# Patient Record
Sex: Female | Born: 1980 | Race: White | Hispanic: No | Marital: Married | Smoking: Never smoker
Health system: Southern US, Community
[De-identification: ages and names within clinical notes are randomized; demographics above are authoritative.]

## PROBLEM LIST (undated history)

## (undated) DIAGNOSIS — N200 Calculus of kidney: Secondary | ICD-10-CM

## (undated) DIAGNOSIS — K219 Gastro-esophageal reflux disease without esophagitis: Secondary | ICD-10-CM

## (undated) DIAGNOSIS — B977 Papillomavirus as the cause of diseases classified elsewhere: Secondary | ICD-10-CM

## (undated) DIAGNOSIS — L719 Rosacea, unspecified: Secondary | ICD-10-CM

## (undated) DIAGNOSIS — Z800 Family history of malignant neoplasm of digestive organs: Secondary | ICD-10-CM

## (undated) HISTORY — DX: Papillomavirus as the cause of diseases classified elsewhere: B97.7

## (undated) HISTORY — DX: Gastro-esophageal reflux disease without esophagitis: K21.9

## (undated) HISTORY — PX: WISDOM TOOTH EXTRACTION: SHX21

## (undated) HISTORY — PX: LITHOTRIPSY: SUR834

## (undated) HISTORY — DX: Family history of malignant neoplasm of digestive organs: Z80.0

## (undated) HISTORY — DX: Calculus of kidney: N20.0

## (undated) HISTORY — DX: Rosacea, unspecified: L71.9

---

## 2006-07-06 ENCOUNTER — Emergency Department: Payer: Self-pay | Admitting: Emergency Medicine

## 2009-03-13 ENCOUNTER — Ambulatory Visit: Payer: Self-pay | Admitting: Gastroenterology

## 2009-10-05 HISTORY — PX: COLONOSCOPY: SHX174

## 2010-01-09 DIAGNOSIS — J329 Chronic sinusitis, unspecified: Secondary | ICD-10-CM | POA: Insufficient documentation

## 2013-03-18 ENCOUNTER — Emergency Department: Payer: Self-pay | Admitting: Internal Medicine

## 2013-03-18 LAB — URINALYSIS, COMPLETE
Bilirubin,UR: NEGATIVE
Glucose,UR: NEGATIVE mg/dL (ref 0–75)
Nitrite: NEGATIVE
Ph: 8 (ref 4.5–8.0)
Protein: NEGATIVE
RBC,UR: 78 /HPF (ref 0–5)
Squamous Epithelial: 4
WBC UR: 1 /HPF (ref 0–5)

## 2013-03-18 LAB — BASIC METABOLIC PANEL
Chloride: 107 mmol/L (ref 98–107)
Creatinine: 0.98 mg/dL (ref 0.60–1.30)
EGFR (African American): >60
EGFR (Non-African Amer.): >60
Glucose: 99 mg/dL (ref 65–99)
Potassium: 3.8 mmol/L (ref 3.5–5.1)
Sodium: 140 mmol/L (ref 136–145)

## 2013-03-18 LAB — CBC
HCT: 36.5 % (ref 35.0–47.0)
HGB: 12.8 g/dL (ref 12.0–16.0)
MCHC: 35.1 g/dL (ref 32.0–36.0)
MCV: 89 fL (ref 80–100)
Platelet: 349 10*3/uL (ref 150–440)
RBC: 4.09 10*6/uL (ref 3.80–5.20)
RDW: 12.4 % (ref 11.5–14.5)
WBC: 4.7 10*3/uL (ref 3.6–11.0)

## 2013-06-07 ENCOUNTER — Emergency Department: Payer: Self-pay | Admitting: Internal Medicine

## 2013-06-07 LAB — COMPREHENSIVE METABOLIC PANEL
Anion Gap: 7 (ref 7–16)
BUN: 13 mg/dL (ref 7–18)
Bilirubin,Total: 1.1 mg/dL — ABNORMAL HIGH (ref 0.2–1.0)
Calcium, Total: 8.6 mg/dL (ref 8.5–10.1)
Chloride: 101 mmol/L (ref 98–107)
Co2: 27 mmol/L (ref 21–32)
Creatinine: 1.03 mg/dL (ref 0.60–1.30)
EGFR (African American): >60
EGFR (Non-African Amer.): >60
Osmolality: 271 (ref 275–301)
SGOT(AST): 19 U/L (ref 15–37)
SGPT (ALT): 15 U/L (ref 12–78)

## 2013-06-07 LAB — CBC
HCT: 37.5 % (ref 35.0–47.0)
HGB: 13.1 g/dL (ref 12.0–16.0)
MCH: 30.9 pg (ref 26.0–34.0)
MCV: 88 fL (ref 80–100)
Platelet: 343 10*3/uL (ref 150–440)
WBC: 10.9 10*3/uL (ref 3.6–11.0)

## 2013-06-07 LAB — URINALYSIS, COMPLETE
Protein: 100
RBC,UR: 122 /HPF (ref 0–5)

## 2013-06-07 LAB — PREGNANCY, URINE: Pregnancy Test, Urine: NEGATIVE m[IU]/mL

## 2014-02-15 ENCOUNTER — Ambulatory Visit: Payer: Self-pay | Admitting: Obstetrics and Gynecology

## 2014-11-21 ENCOUNTER — Other Ambulatory Visit: Payer: 59

## 2014-11-21 ENCOUNTER — Encounter: Payer: Self-pay | Admitting: General Surgery

## 2014-11-21 ENCOUNTER — Ambulatory Visit (INDEPENDENT_AMBULATORY_CARE_PROVIDER_SITE_OTHER): Payer: 59 | Admitting: General Surgery

## 2014-11-21 VITALS — BP 118/74 | HR 74 | Resp 12 | Ht 66.5 in | Wt 181.0 lb

## 2014-11-21 DIAGNOSIS — N6310 Unspecified lump in the right breast, unspecified quadrant: Secondary | ICD-10-CM | POA: Insufficient documentation

## 2014-11-21 DIAGNOSIS — N63 Unspecified lump in breast: Secondary | ICD-10-CM

## 2014-11-21 HISTORY — PX: BREAST BIOPSY: SHX20

## 2014-11-21 NOTE — Progress Notes (Signed)
Patient ID: Briana Cummings, female   DOB: [birth year], 34 y.o.   MRN: [id number]  Chief Complaint  Patient presents with  . Other    right breast mass    HPI Briana Cummings is a 34 y.o. female who presents for an evaluation of a right breast mass. The patient's most recent mammogram was performed on 02/15/14 as well as a right breast ultrasound. The patient states her OBGYN found the lump in March/April 2015. She states the area is tender at times. She has noticed a change in the size of the lump since first noticing it. It has developed a more nodular character. She has not appreciated fluctuation in size or tenderness in relation to her menses. It's location in the upper outer quadrant of the right breast. No injuries to the breasts.    HPI  Past Medical History  Diagnosis Date  . Kidney stones   . HPV in female   . GERD (gastroesophageal reflux disease)   . Rosacea     Past Surgical History  Procedure Laterality Date  . Wisdom tooth extraction    . Lithotripsy    . Colonoscopy  2011    because of family history    Family History  Problem Relation Age of Onset  . Cancer Mother 5651    colon/genetic negative  . Cancer Paternal Grandmother   . Cancer Paternal Grandfather     Social History History  Substance Use Topics  . Smoking status: Never Smoker   . Smokeless tobacco: Never Used  . Alcohol Use: 0.0 oz/week    0 Standard drinks or equivalent per week     Comment: occasionally    Allergies  Allergen Reactions  . Amoxicillin Rash    Current Outpatient Prescriptions  Medication Sig Dispense Refill  . ibuprofen (ADVIL,MOTRIN) 200 MG tablet Take 200 mg by mouth as needed.     No current facility-administered medications for this visit.    Review of Systems Review of Systems  Constitutional: Negative.   Respiratory: Negative.   Cardiovascular: Negative.     Blood pressure 118/74, pulse 74, resp. rate 12, height 5' 6.5" (1.689 m), weight 181 lb (82.101 kg),  last menstrual period 11/18/2014.  Physical Exam Physical Exam  Constitutional: She is oriented to person, place, and time. She appears well-developed and well-nourished.  Neck: Neck supple.  Cardiovascular: Normal rate, regular rhythm and normal heart sounds.   Pulmonary/Chest: Effort normal and breath sounds normal. Right breast exhibits no inverted nipple, no mass, no nipple discharge, no skin change and no tenderness. Left breast exhibits no inverted nipple, no mass, no nipple discharge, no skin change and no tenderness.    Focal thickening 2 cm area right axillary tail  Lymphadenopathy:    She has no cervical adenopathy.    She has no axillary adenopathy.  Neurological: She is alert and oriented to person, place, and time.  Skin: Skin is warm and dry.    Data Reviewed Mammograms dated 02/15/2014 as well as associated ultrasound of the right breast were reviewed. Dense breast tissue with a normal lymph node reported. BI-RADS-3.  PCP notes of 10/30/2014 reviewed.  Ultrasound examination was completed to reassess the area that the patient feels is larger and more nodular. There is significant variability on how close the underlying breast parenchyma approaches the overlying skin. The area of prominence near the axillary tail is an area where the breast parenchyma approached within 6 mm of the overlying skin. At the 11:00 position, 10 cm  from the nipple the previously identified lymph node is noted measuring 0.5 x 0.6 x 1.0 cm. There is variably hyperechoic and hypoechoic breast tissue associated with this node.  The patient was offered biopsy to confirm the clinical impression of benign breast parenchyma and lymph node. Pros and cons of the procedure were reviewed. She was amenable to proceed.  10 mL of 0.5% Xylocaine with 0.25% Marcaine with 1-200,000 of epinephrine was utilized well tolerated. ChloraPrep was applied to the skin. A 14-gauge Finesse device was utilized and approximately  10 core samples obtained throughout the area of concern. The patient tolerated the procedure well. Scant discomfort. Postbiopsy clip was placed. Skin defect closed with benzoin and Steri-Strips followed by Telfa and Tegaderm dressing.  Assessment    Right breast mass, likely prominent parenchyma.    Plan    Postbiopsy instructions were provided. She'll be contacted when pathology is returned. She'll plan for a follow up with the staff in one week for wound check.       PCP:  Lorie Phenix Ref. MD: [facility]   Donnalee Curry W 11/21/2014, 9:22 PM

## 2014-11-21 NOTE — Patient Instructions (Signed)

## 2014-11-22 ENCOUNTER — Telehealth: Payer: Self-pay | Admitting: *Deleted

## 2014-11-22 NOTE — Telephone Encounter (Signed)
Phone call from Dr Luisa HartPatrick most recent breast biopsy, benign- Pseudoangiomatous stromal hyperplasia-PASH. Notify patient biopsy "OK" per Dr. Lemar LivingsByrnett.

## 2014-11-22 NOTE — Telephone Encounter (Signed)
Notified patient as instructed, patient pleased. Discussed follow-up appointments, patient agrees  

## 2014-11-28 ENCOUNTER — Ambulatory Visit (INDEPENDENT_AMBULATORY_CARE_PROVIDER_SITE_OTHER): Payer: 59 | Admitting: *Deleted

## 2014-11-28 DIAGNOSIS — N6310 Unspecified lump in the right breast, unspecified quadrant: Secondary | ICD-10-CM

## 2014-11-28 DIAGNOSIS — N63 Unspecified lump in breast: Secondary | ICD-10-CM

## 2014-11-28 NOTE — Progress Notes (Signed)
Patient here today for follow up post right breast biopsy.Minimal bruising noted.  The patient is aware that a heating pad may be used for comfort as needed.  Aware of pathology. Follow up in one month with doctor.

## 2014-12-25 ENCOUNTER — Ambulatory Visit (INDEPENDENT_AMBULATORY_CARE_PROVIDER_SITE_OTHER): Payer: 59 | Admitting: General Surgery

## 2014-12-25 ENCOUNTER — Encounter: Payer: Self-pay | Admitting: General Surgery

## 2014-12-25 VITALS — BP 124/72 | HR 82 | Resp 12 | Ht 66.5 in | Wt 181.0 lb

## 2014-12-25 DIAGNOSIS — N6489 Other specified disorders of breast: Secondary | ICD-10-CM | POA: Insufficient documentation

## 2014-12-25 DIAGNOSIS — N62 Hypertrophy of breast: Secondary | ICD-10-CM

## 2014-12-25 NOTE — Patient Instructions (Addendum)
Continue self breast exams. Call office for any new breast issues or concerns. The patient is aware to call back for any questions or concerns. 

## 2014-12-25 NOTE — Progress Notes (Signed)
Patient ID: Briana Cummings, female   DOB: 05/05/1981, 34 y.o.   MRN: [id number]  Chief Complaint  Patient presents with  . Follow-up    right breast biopsy    HPI Briana Cummings is a 34 y.o. female.  Here today for follow up of right breast biopsy on 11-21-14. No new breast issues. Occasional pain in left breast that comes and goes for about 4-5 months. Described as more of an "awareness" and sporadic.  HPI  Past Medical History  Diagnosis Date  . Kidney stones   . HPV in female   . GERD (gastroesophageal reflux disease)   . Rosacea     Past Surgical History  Procedure Laterality Date  . Wisdom tooth extraction    . Lithotripsy    . Colonoscopy  2011    because of family history  . Breast biopsy Right 11-21-14    PSEUDOANGIOMATOUS STROMAL HYPERPLASIA    Family History  Problem Relation Age of Onset  . Cancer Mother 5651    colon/genetic negative  . Cancer Paternal Grandmother   . Cancer Paternal Grandfather   . Cancer Paternal Aunt     great Aunt/breast  . Cancer Paternal Aunt     great Aunt/breast    Social History History  Substance Use Topics  . Smoking status: Never Smoker   . Smokeless tobacco: Never Used  . Alcohol Use: 0.0 oz/week    0 Standard drinks or equivalent per week     Comment: occasionally    Allergies  Allergen Reactions  . Amoxicillin Rash    Current Outpatient Prescriptions  Medication Sig Dispense Refill  . ibuprofen (ADVIL,MOTRIN) 200 MG tablet Take 200 mg by mouth as needed.    . loratadine (CLARITIN) 10 MG tablet Take 10 mg by mouth daily as needed.   6  . montelukast (SINGULAIR) 10 MG tablet Take 10 mg by mouth as needed.   4   No current facility-administered medications for this visit.    Review of Systems Review of Systems  Constitutional: Negative.   Respiratory: Negative.   Cardiovascular: Negative.     Blood pressure 124/72, pulse 82, resp. rate 12, height 5' 6.5" (1.689 m), weight 181 lb (82.101 kg), last menstrual  period 12/16/2014.  Physical Exam Physical Exam  Constitutional: She is oriented to person, place, and time. She appears well-developed and well-nourished.  Neck: Neck supple.  Cardiovascular: Normal rate, regular rhythm and normal heart sounds.   Pulmonary/Chest: Effort normal and breath sounds normal. Right breast exhibits no inverted nipple, no mass, no nipple discharge, no skin change and no tenderness. Left breast exhibits no inverted nipple, no mass, no nipple discharge, no skin change and no tenderness.    Lymphadenopathy:    She has no cervical adenopathy.    She has no axillary adenopathy.  Neurological: She is alert and oriented to person, place, and time.  Skin: Skin is warm and dry.    Data Reviewed Pathology of the February 2016 biopsies showed pseudo-adenomatous stromal hyperplasia. No atypia or malignancy.   Assessment    Benign breast exam.    Plan    The patient's family history does not warrant screening for hereditary breast cancer.  Screening mammograms at age 34.  The patient was encouraged to continue monthly self examination.    Follow up as needed. Continue self breast exams. Call office for any new breast issues or concerns.    PCP:  Lonn GeorgiaMaloney, Nancy  Tynan Boesel W 12/25/2014, 8:24 PM

## 2015-10-06 HISTORY — PX: BREAST LUMPECTOMY: SHX2

## 2015-10-23 DIAGNOSIS — D239 Other benign neoplasm of skin, unspecified: Secondary | ICD-10-CM

## 2015-10-23 HISTORY — DX: Other benign neoplasm of skin, unspecified: D23.9

## 2017-02-17 ENCOUNTER — Encounter: Payer: Self-pay | Admitting: Obstetrics and Gynecology

## 2017-02-17 ENCOUNTER — Ambulatory Visit (INDEPENDENT_AMBULATORY_CARE_PROVIDER_SITE_OTHER): Payer: Commercial Managed Care - HMO | Admitting: Obstetrics and Gynecology

## 2017-02-17 VITALS — BP 110/70 | HR 86 | Ht 66.0 in | Wt 188.0 lb

## 2017-02-17 DIAGNOSIS — Z1151 Encounter for screening for human papillomavirus (HPV): Secondary | ICD-10-CM | POA: Diagnosis not present

## 2017-02-17 DIAGNOSIS — Z1231 Encounter for screening mammogram for malignant neoplasm of breast: Secondary | ICD-10-CM | POA: Diagnosis not present

## 2017-02-17 DIAGNOSIS — Z1239 Encounter for other screening for malignant neoplasm of breast: Secondary | ICD-10-CM

## 2017-02-17 DIAGNOSIS — Z01419 Encounter for gynecological examination (general) (routine) without abnormal findings: Secondary | ICD-10-CM | POA: Diagnosis not present

## 2017-02-17 DIAGNOSIS — Z124 Encounter for screening for malignant neoplasm of cervix: Secondary | ICD-10-CM | POA: Diagnosis not present

## 2017-02-17 DIAGNOSIS — Z800 Family history of malignant neoplasm of digestive organs: Secondary | ICD-10-CM | POA: Diagnosis not present

## 2017-02-17 NOTE — Progress Notes (Signed)
Chief Complaint  Patient presents with  . Gynecologic Exam     HPI:      [name] is a 36 y.o. G0P0000 who LMP was Patient's last menstrual period was 01/29/2017 (exact date)., presents today for her annual examination.  Her menses are regular every 28-30 days, lasting 5 days.  Dysmenorrhea moderate, occurring first 1-2 days of flow. She takes ibup with relief. She does not have intermenstrual bleeding.  Sex activity: single partner, contraception - condoms most of the time. She declines any other BC. She may want to conceive in future. Last Pap: Feb 13, 2015  Results were: no abnormalities /neg HPV DNA . Hx of ASCUS/POS HPV DNA 2016 with neg colpo/bx.  Hx of STDs: HPV  Last mammogram:2016. Pt had mass that was PASH on bx. She saw Dr. Wendee Copp at [facility] last yr. She is unsure if she is due for another mammogram.  There is no FH of breast cancer. There is no FH of ovarian cancer. The patient does do self-breast exams.  Tobacco use: The patient denies current or previous tobacco use. Alcohol use: none Exercise: not active  She does not get adequate calcium and Vitamin D in her diet.  She has a FH of colon cancer in her mom and has already had a colonosopy. She thinks she is due again at age 49.  Past Medical History:  Diagnosis Date  . GERD (gastroesophageal reflux disease)   . HPV in female   . Kidney stones   . Rosacea     Past Surgical History:  Procedure Laterality Date  . BREAST BIOPSY Right 11-21-14   PSEUDOANGIOMATOUS STROMAL HYPERPLASIA  . COLONOSCOPY  2011   because of family history  . LITHOTRIPSY    . WISDOM TOOTH EXTRACTION      Family History  Problem Relation Age of Onset  . Cancer Mother 4       colon/genetic negative  . Cancer Paternal Grandmother        breast  . Cancer Paternal Grandfather        lung  . Heart attack Paternal Grandfather   . Cancer Paternal Aunt         great aunt/ ? breast  . Cancer Paternal Aunt        great Aunt/?  breast  . Prostate cancer Maternal Uncle     Social History   Social History  . Marital status: Married    Spouse name: N/A  . Number of children: N/A  . Years of education: N/A   Occupational History  . Not on file.   Social History Main Topics  . Smoking status: Never Smoker  . Smokeless tobacco: Never Used  . Alcohol use 0.0 oz/week     Comment: occasionally  . Drug use: No  . Sexual activity: Yes    Birth control/ protection: None   Other Topics Concern  . Not on file   Social History Narrative  . No narrative on file     Current Outpatient Prescriptions:  .  ibuprofen (ADVIL,MOTRIN) 200 MG tablet, Take 200 mg by mouth as needed., Disp: , Rfl:   ROS:  Review of Systems  Constitutional: Negative for fatigue, fever and unexpected weight change.  Respiratory: Negative for cough, shortness of breath and wheezing.   Cardiovascular: Negative for chest pain, palpitations and leg swelling.  Gastrointestinal: Positive for diarrhea. Negative for blood in stool, constipation, nausea and vomiting.  Endocrine: Negative for cold intolerance, heat intolerance and  polyuria.  Genitourinary: Positive for vaginal discharge. Negative for dyspareunia, dysuria, flank pain, frequency, genital sores, hematuria, menstrual problem, pelvic pain, urgency, vaginal bleeding and vaginal pain.  Musculoskeletal: Negative for back pain, joint swelling and myalgias.  Skin: Negative for rash.  Neurological: Positive for headaches. Negative for dizziness, syncope, light-headedness and numbness.  Hematological: Negative for adenopathy.  Psychiatric/Behavioral: Negative for agitation, confusion, sleep disturbance and suicidal ideas. The patient is not nervous/anxious.      Objective: BP 110/70   Pulse 86   Ht 5\' 6"  (1.676 m)   Wt 188 lb (85.3 kg)   LMP 01/29/2017 (Exact Date)   BMI 30.34 kg/m    Physical Exam  Constitutional: She is oriented to person, place, and time. She appears  well-developed and well-nourished.  Genitourinary: Vagina normal and uterus normal. There is no rash or tenderness on the right labia. There is no rash or tenderness on the left labia. No erythema or tenderness in the vagina. No vaginal discharge found. Right adnexum does not display mass and does not display tenderness. Left adnexum does not display mass and does not display tenderness. Cervix does not exhibit motion tenderness or polyp. Uterus is not enlarged or tender.  Neck: Normal range of motion. No thyromegaly present.  Cardiovascular: Normal rate, regular rhythm and normal heart sounds.   No murmur heard. Pulmonary/Chest: Effort normal and breath sounds normal. Right breast exhibits no mass, no nipple discharge, no skin change and no tenderness. Left breast exhibits no mass, no nipple discharge, no skin change and no tenderness.  Abdominal: Soft. There is no tenderness. There is no guarding.  Musculoskeletal: Normal range of motion.  Neurological: She is alert and oriented to person, place, and time. No cranial nerve deficit.  Psychiatric: She has a normal mood and affect. Her behavior is normal.  Vitals reviewed.    Assessment/Plan: Encounter for annual routine gynecological examination  Cervical cancer screening - Plan: IGP, Aptima HPV  Screening for HPV (human papillomavirus) - Plan: IGP, Aptima HPV  Screening for breast cancer - Hx of PASH. Pt to f/u with Dr. Wendee CoppGeorgaide as to when she is due for mammograms. Will call for ref prn.  Family history of colon cancer - Pt due for repeat colonoscopy age 36.              GYN counsel mammography screening, adequate intake of calcium and vitamin D, diet and exercise     F/U  Return in about 1 year (around 02/17/2018).  Alicia B. Copland, PA-C 02/17/2017 8:43 AM

## 2017-02-20 LAB — IGP, APTIMA HPV
HPV Aptima: NEGATIVE
PAP Smear Comment: 0

## 2018-02-21 ENCOUNTER — Other Ambulatory Visit: Payer: Self-pay

## 2018-02-21 ENCOUNTER — Ambulatory Visit (INDEPENDENT_AMBULATORY_CARE_PROVIDER_SITE_OTHER): Payer: 59 | Admitting: Obstetrics and Gynecology

## 2018-02-21 ENCOUNTER — Encounter: Payer: Self-pay | Admitting: Obstetrics and Gynecology

## 2018-02-21 VITALS — BP 120/78 | HR 110 | Ht 66.0 in | Wt 195.0 lb

## 2018-02-21 DIAGNOSIS — Z800 Family history of malignant neoplasm of digestive organs: Secondary | ICD-10-CM

## 2018-02-21 DIAGNOSIS — N62 Hypertrophy of breast: Secondary | ICD-10-CM

## 2018-02-21 DIAGNOSIS — Z13220 Encounter for screening for lipoid disorders: Secondary | ICD-10-CM | POA: Diagnosis not present

## 2018-02-21 DIAGNOSIS — Z1151 Encounter for screening for human papillomavirus (HPV): Secondary | ICD-10-CM | POA: Diagnosis not present

## 2018-02-21 DIAGNOSIS — Z1239 Encounter for other screening for malignant neoplasm of breast: Secondary | ICD-10-CM

## 2018-02-21 DIAGNOSIS — Z01419 Encounter for gynecological examination (general) (routine) without abnormal findings: Secondary | ICD-10-CM

## 2018-02-21 DIAGNOSIS — Z1321 Encounter for screening for nutritional disorder: Secondary | ICD-10-CM | POA: Diagnosis not present

## 2018-02-21 DIAGNOSIS — Z124 Encounter for screening for malignant neoplasm of cervix: Secondary | ICD-10-CM

## 2018-02-21 DIAGNOSIS — Z131 Encounter for screening for diabetes mellitus: Secondary | ICD-10-CM

## 2018-02-21 DIAGNOSIS — N6489 Other specified disorders of breast: Secondary | ICD-10-CM

## 2018-02-21 DIAGNOSIS — Z0000 Encounter for general adult medical examination without abnormal findings: Secondary | ICD-10-CM | POA: Diagnosis not present

## 2018-02-21 DIAGNOSIS — Z1231 Encounter for screening mammogram for malignant neoplasm of breast: Secondary | ICD-10-CM

## 2018-02-21 NOTE — Progress Notes (Signed)
Chief Complaint  Patient presents with  . Gynecologic Exam    No complaints     HPI:      Briana Cummings is a 37 y.o. G0P0000 who LMP was Patient's last menstrual period was 01/27/2018., presents today for her annual examination. Her menses are regular every 28-30 days, lasting 5 days. Dysmenorrhea moderate, occurring first 1-2 days of flow. She takes ibup with relief. She does not have intermenstrual bleeding.  Sex activity: single partner, contraception. She declines any other BC.  Last Pap: 02/17/17  Results were: no abnormalities /neg HPV DNA . Hx of ASCUS/POS HPV DNA 2016 with neg colpo/bx.  Hx of STDs: HPV  Last mammogram:2016. Pt had mass that was PASH on bx. She saw Dr. Wendee Copp at [facility] last yr. Never had repeat mammo last yr. There is a FH of breast cancer in her pat aunt, genetic testing not indicated. There is no FH of ovarian cancer. The patient does do self-breast exams. She has a FH of colon cancer in her mom ("gene neg") and has already had a colonosopy. She thinks she is due again at age 85.  Tobacco use: The patient denies current or previous tobacco use. Alcohol use: none Exercise: not active  She does get adequate calcium but not Vitamin D in her diet. Due for fasting labs.   Past Medical History:  Diagnosis Date  . Family history of colon cancer    mom age 66; colonoscopy due age 18  . GERD (gastroesophageal reflux disease)   . HPV in female   . Kidney stones   . Rosacea     Past Surgical History:  Procedure Laterality Date  . BREAST BIOPSY Right 11-21-14   PSEUDOANGIOMATOUS STROMAL HYPERPLASIA  . BREAST LUMPECTOMY Right 2017  . COLONOSCOPY  2011   because of family history  . LITHOTRIPSY    . WISDOM TOOTH EXTRACTION      Family History  Problem Relation Age of Onset  . Colon cancer Mother 13       genetic negative  . Cancer Paternal Grandmother        lung primary, extensive mets  . Cancer Paternal Grandfather        lung  .  Heart attack Paternal Grandfather   . Breast cancer Paternal Aunt 38  . Prostate cancer Maternal Uncle     Social History   Socioeconomic History  . Marital status: Married    Spouse name: Not on file  . Number of children: Not on file  . Years of education: Not on file  . Highest education level: Not on file  Occupational History  . Not on file  Social Needs  . Financial resource strain: Not on file  . Food insecurity:    Worry: Not on file    Inability: Not on file  . Transportation needs:    Medical: Not on file    Non-medical: Not on file  Tobacco Use  . Smoking status: Never Smoker  . Smokeless tobacco: Never Used  Substance and Sexual Activity  . Alcohol use: Yes    Alcohol/week: 0.0 oz    Comment: occasionally  . Drug use: No  . Sexual activity: Yes    Birth control/protection: None  Lifestyle  . Physical activity:    Days per week: 0 days    Minutes per session: Not on file  . Stress: Not on file  Relationships  . Social connections:    Talks on phone: Not on  file    Gets together: Not on file    Attends religious service: Not on file    Active member of club or organization: Not on file    Attends meetings of clubs or organizations: Not on file    Relationship status: Not on file  . Intimate partner violence:    Fear of current or ex partner: Not on file    Emotionally abused: Not on file    Physically abused: Not on file    Forced sexual activity: Not on file  Other Topics Concern  . Not on file  Social History Narrative  . Not on file    Current Outpatient Medications on File Prior to Visit  Medication Sig Dispense Refill  . diphenhydramine-acetaminophen (TYLENOL PM) 25-500 MG TABS tablet Take 1 tablet by mouth at bedtime as needed.    Marland Kitchen ibuprofen (ADVIL,MOTRIN) 200 MG tablet Take 200 mg by mouth as needed.     No current facility-administered medications on file prior to visit.      ROS:  Review of Systems  Constitutional: Negative for  fatigue, fever and unexpected weight change.  Respiratory: Negative for cough, shortness of breath and wheezing.   Cardiovascular: Negative for chest pain, palpitations and leg swelling.  Gastrointestinal: Negative for blood in stool, constipation, diarrhea, nausea and vomiting.  Endocrine: Positive for polyuria. Negative for cold intolerance and heat intolerance.  Genitourinary: Negative for dyspareunia, dysuria, flank pain, frequency, genital sores, hematuria, menstrual problem, pelvic pain, urgency, vaginal bleeding, vaginal discharge and vaginal pain.  Musculoskeletal: Negative for back pain, joint swelling and myalgias.  Skin: Negative for rash.  Neurological: Negative for dizziness, syncope, light-headedness, numbness and headaches.  Hematological: Negative for adenopathy.  Psychiatric/Behavioral: Negative for agitation, confusion, sleep disturbance and suicidal ideas. The patient is not nervous/anxious.      Objective: BP 120/78 (BP Location: Left Arm, Patient Position: Sitting, Cuff Size: Normal)   Pulse (!) 110   Ht  (1.676 m)   Wt 195 lb (88.5 kg)   LMP 01/27/2018   BMI 31.47 kg/m    Physical Exam  Constitutional: She is oriented to person, place, and time. She appears well-developed and well-nourished.  Genitourinary: Vagina normal and uterus normal. There is no rash or tenderness on the right labia. There is no rash or tenderness on the left labia. No erythema or tenderness in the vagina. No vaginal discharge found. Right adnexum does not display mass and does not display tenderness. Left adnexum does not display mass and does not display tenderness. Cervix does not exhibit motion tenderness or polyp. Uterus is not enlarged or tender.  Neck: Normal range of motion. No thyromegaly present.  Cardiovascular: Normal rate, regular rhythm and normal heart sounds.  No murmur heard. Pulmonary/Chest: Effort normal and breath sounds normal. Right breast exhibits no mass, no  nipple discharge, no skin change and no tenderness. Left breast exhibits no mass, no nipple discharge, no skin change and no tenderness.  Abdominal: Soft. There is no tenderness. There is no guarding.  Musculoskeletal: Normal range of motion.  Neurological: She is alert and oriented to person, place, and time. No cranial nerve deficit.  Psychiatric: She has a normal mood and affect. Her behavior is normal.  Vitals reviewed.   Assessment/Plan: Encounter for annual routine gynecological examination  Cervical cancer screening - Plan: IGP, Aptima HPV  Screening for HPV (human papillomavirus) - Plan: IGP, Aptima HPV  Screening for breast cancer - Pt to sched mammo.  - Plan: MM  3D SCREEN BREAST BILATERAL  Pseudoangiomatous stromal hyperplasia of breast  Family history of colon cancer - Pt due for colonoscopy age 26. Doesn't qualify for cancer genetic testing.  Blood tests for routine general physical examination - Plan: Comprehensive metabolic panel, Lipid Panel With LDL/HDL Ratio, VITAMIN D 25 Hydroxy (Vit-D Deficiency, Fractures), Hgb A1c w/o eAG  Screening cholesterol level - Plan: Lipid Panel With LDL/HDL Ratio  Encounter for vitamin deficiency screening - Plan: VITAMIN D 25 Hydroxy (Vit-D Deficiency, Fractures)  Screening for diabetes mellitus - Plan: Hgb A1c w/o eAG       GYN counsel breast self exam, mammography screening, adequate intake of calcium and vitamin D, diet and exercise     F/U  Return in about 1 year (around 02/22/2019).  Anaiya Wisinski B. Suraj Ramdass, PA-C 02/21/2018 3:56 PM

## 2018-02-21 NOTE — Patient Instructions (Signed)
I value your feedback and entrusting us with your care. If you get a [facility] patient survey, I would appreciate you taking the time to let us know about your experience today. Thank you! 

## 2018-02-24 LAB — IGP, APTIMA HPV
HPV APTIMA: NEGATIVE
PAP SMEAR COMMENT: 0

## 2018-03-07 ENCOUNTER — Ambulatory Visit: Payer: 59 | Admitting: Physician Assistant

## 2018-03-07 ENCOUNTER — Encounter: Payer: Self-pay | Admitting: Physician Assistant

## 2018-03-07 VITALS — BP 124/80 | HR 84 | Temp 98.9°F | Resp 16 | Wt 194.0 lb

## 2018-03-07 DIAGNOSIS — J0110 Acute frontal sinusitis, unspecified: Secondary | ICD-10-CM | POA: Diagnosis not present

## 2018-03-07 DIAGNOSIS — R21 Rash and other nonspecific skin eruption: Secondary | ICD-10-CM

## 2018-03-07 MED ORDER — DOXYCYCLINE HYCLATE 100 MG PO TABS: 100.0000 mg | ORAL_TABLET | Freq: Two times a day (BID) | ORAL | 0 refills | Status: AC

## 2018-03-07 NOTE — Progress Notes (Signed)
Patient: Briana Cummings Female    DOB: 03/22/1981   37 y.o.   MRN: [id number] Visit Date: 03/08/2018  Today's Provider: Trey SailorsAdriana M Pollak, PA-C   Chief Complaint  Patient presents with  . Sinusitis   Subjective:    Briana SiasLaura Cummings is a 37 y/o woman presenting today with worsening sinus congestion. Does not recall having allergies but has been on singulair in the past. Currently not taking any allergy medications. She also has a rash that has been present on her chest for several months that she reports has spread to her neck. It does not itch, weep, burn. She has a dermatologist, Dr. Gwen Cummings, whom she has not contacted yet. She reports paying 400 dollars for her last visit there.   Sinusitis  This is a chronic problem. The current episode started more than 1 month ago. The problem has been gradually worsening (Especially in the past week or month. ) since onset. Associated symptoms include congestion, ear pain, headaches, sinus pressure and a sore throat. Pertinent negatives include no chills, coughing, diaphoresis, shortness of breath or sneezing.     Allergies  Allergen Reactions  . Peanut Oil Itching  . Amoxicillin Rash  . Penicillins Rash     Current Outpatient Medications:  .  diphenhydramine-acetaminophen (TYLENOL PM) 25-500 MG TABS tablet, Take 1 tablet by mouth at bedtime as needed., Disp: , Rfl:  .  doxycycline (VIBRA-TABS) 100 MG tablet, Take 1 tablet (100 mg total) by mouth 2 (two) times daily for 7 days., Disp: 14 tablet, Rfl: 0 .  ibuprofen (ADVIL,MOTRIN) 200 MG tablet, Take 200 mg by mouth as needed., Disp: , Rfl:   Review of Systems  Constitutional: Positive for fatigue. Negative for activity change, appetite change, chills, diaphoresis, fever and unexpected weight change.  HENT: Positive for congestion, ear pain, postnasal drip, sinus pressure, sinus pain and sore throat. Negative for ear discharge, rhinorrhea, sneezing, tinnitus, trouble swallowing and voice  change.   Respiratory: Negative for apnea, cough, choking, chest tightness, shortness of breath, wheezing and stridor.   Gastrointestinal: Positive for diarrhea and nausea. Negative for abdominal distention, abdominal pain, anal bleeding, blood in stool, constipation, rectal pain and vomiting.  Skin: Positive for rash.  Allergic/Immunologic: Positive for environmental allergies.  Neurological: Positive for headaches. Negative for dizziness and light-headedness.    Social History   Tobacco Use  . Smoking status: Never Smoker  . Smokeless tobacco: Never Used  Substance Use Topics  . Alcohol use: Yes    Alcohol/week: 0.0 oz    Comment: occasionally   Objective:   BP 124/80 (BP Location: Right Arm, Patient Position: Sitting, Cuff Size: Large)   Pulse 84   Temp 98.9 F (37.2 C) (Oral)   Resp 16   Wt 194 lb (88 kg)   BMI 31.31 kg/m  Vitals:   03/07/18 1534  BP: 124/80  Pulse: 84  Resp: 16  Temp: 98.9 F (37.2 C)  TempSrc: Oral  Weight: 194 lb (88 kg)     Physical Exam  Constitutional: She is oriented to person, place, and time. She appears well-developed and well-nourished. No distress.  HENT:  Right Ear: External ear normal.  Left Ear: External ear normal.  Nose: Right sinus exhibits maxillary sinus tenderness and frontal sinus tenderness. Left sinus exhibits maxillary sinus tenderness and frontal sinus tenderness.  Mouth/Throat: Oropharynx is clear and moist. No oropharyngeal exudate, posterior oropharyngeal edema or posterior oropharyngeal erythema.  Tms opaque bilaterally   Eyes: Conjunctivae are  normal. Right eye exhibits no discharge. Left eye exhibits no discharge.  Neck: Neck supple.  Cardiovascular: Normal rate and regular rhythm.  Pulmonary/Chest: Effort normal and breath sounds normal.  Lymphadenopathy:    She has no cervical adenopathy.  Neurological: She is alert and oriented to person, place, and time.  Skin: Skin is warm and dry. Rash noted. She is not  diaphoretic.  Erythematous macules on chest and posterior neck.   Psychiatric: She has a normal mood and affect. Her behavior is normal.        Assessment & Plan:     1. Acute non-recurrent frontal sinusitis  Worsening facial pain, sinus congestion. Advise taking daily allergy medication.   - doxycycline (VIBRA-TABS) 100 MG tablet; Take 1 tablet (100 mg total) by mouth 2 (two) times daily for 7 days.  Dispense: 14 tablet; Refill: 0  2. Rash  Uncertain etiology. Recommend contacting dermatologist.   Return if symptoms worsen or fail to improve.  The entirety of the information documented in the History of Present Illness, Review of Systems and Physical Exam were personally obtained by me. Portions of this information were initially documented by Kavin Leech, CMA and reviewed by me for thoroughness and accuracy.          Briana Sailors, PA-C  [facility]

## 2018-03-07 NOTE — Patient Instructions (Signed)

## 2018-03-17 DIAGNOSIS — B360 Pityriasis versicolor: Secondary | ICD-10-CM | POA: Diagnosis not present

## 2018-05-04 DIAGNOSIS — H52222 Regular astigmatism, left eye: Secondary | ICD-10-CM | POA: Diagnosis not present

## 2018-10-19 ENCOUNTER — Ambulatory Visit: Payer: 59 | Admitting: Physician Assistant

## 2018-10-19 ENCOUNTER — Encounter: Payer: Self-pay | Admitting: Physician Assistant

## 2018-10-19 VITALS — BP 107/75 | HR 121 | Temp 98.5°F | Resp 16 | Wt 202.0 lb

## 2018-10-19 DIAGNOSIS — J40 Bronchitis, not specified as acute or chronic: Secondary | ICD-10-CM | POA: Diagnosis not present

## 2018-10-19 MED ORDER — PREDNISONE 10 MG (21) PO TBPK: ORAL_TABLET | ORAL | 0 refills | Status: DC

## 2018-10-19 MED ORDER — ALBUTEROL SULFATE HFA 108 (90 BASE) MCG/ACT IN AERS: 2.0000 | INHALATION_SPRAY | Freq: Four times a day (QID) | RESPIRATORY_TRACT | 0 refills | Status: DC | PRN

## 2018-10-19 NOTE — Patient Instructions (Signed)
Acute Bronchitis, Adult Acute bronchitis is when air tubes (bronchi) in the lungs suddenly get swollen. The condition can make it hard to breathe. It can also cause these symptoms:  A cough.  Coughing up clear, yellow, or green mucus.  Wheezing.  Chest congestion.  Shortness of breath.  A fever.  Body aches.  Chills.  A sore throat. Follow these instructions at home:  Medicines  Take over-the-counter and prescription medicines only as told by your doctor.  If you were prescribed an antibiotic medicine, take it as told by your doctor. Do not stop taking the antibiotic even if you start to feel better. General instructions  Rest.  Drink enough fluids to keep your pee (urine) pale yellow.  Avoid smoking and secondhand smoke. If you smoke and you need help quitting, ask your doctor. Quitting will help your lungs heal faster.  Use an inhaler, cool mist vaporizer, or humidifier as told by your doctor.  Keep all follow-up visits as told by your doctor. This is important. How is this prevented? To lower your risk of getting this condition again:  Wash your hands often with soap and water. If you cannot use soap and water, use hand sanitizer.  Avoid contact with people who have cold symptoms.  Try not to touch your hands to your mouth, nose, or eyes.  Make sure to get the flu shot every year. Contact a doctor if:  Your symptoms do not get better in 2 weeks. Get help right away if:  You cough up blood.  You have chest pain.  You have very bad shortness of breath.  You become dehydrated.  You faint (pass out) or keep feeling like you are going to pass out.  You keep throwing up (vomiting).  You have a very bad headache.  Your fever or chills gets worse. This information is not intended to replace advice given to you by your health care provider. Make sure you discuss any questions you have with your health care provider. Document Released: 03/09/2008 Document  Revised: 05/05/2017 Document Reviewed: 03/11/2016 Elsevier Interactive Patient Education  2019 Elsevier Inc.  

## 2018-10-19 NOTE — Progress Notes (Signed)
Patient: Briana Cummings Female    DOB: 12-29-1980   38 y.o.   MRN: [id number] Visit Date: 10/21/2018  Today's Provider: Trey Sailors, PA-C   Chief Complaint  Patient presents with  . Cough   Subjective:     HPI Upper Respiratory Infection: Patient complains of symptoms of a URI, possible sinusitis. Symptoms include congestion and cough. Onset of symptoms was 6 days ago, gradually worsening since that time. She also c/o congestion, post nasal drip and sore throat for the past 2 days .  She is drinking plenty of fluids. Evaluation to date: none. Treatment to date: cough suppressants and decongestants. Nyquil. Patient reports fever yesterday 100.3.    Allergies  Allergen Reactions  . Peanut Oil Itching  . Amoxicillin Rash  . Penicillins Rash     Current Outpatient Medications:  .  diphenhydramine-acetaminophen (TYLENOL PM) 25-500 MG TABS tablet, Take 1 tablet by mouth at bedtime as needed., Disp: , Rfl:  .  albuterol (PROVENTIL HFA;VENTOLIN HFA) 108 (90 Base) MCG/ACT inhaler, Inhale 2 puffs into the lungs every 6 (six) hours as needed for wheezing or shortness of breath., Disp: 1 Inhaler, Rfl: 0 .  predniSONE (STERAPRED UNI-PAK 21 TAB) 10 MG (21) TBPK tablet, Take 6 pills on day 1, take 5 pills on day 2 and so on until complete., Disp: 21 tablet, Rfl: 0  Review of Systems  Constitutional: Positive for fever.  HENT: Positive for congestion, ear pain, rhinorrhea and sore throat.   Respiratory: Positive for cough, shortness of breath and wheezing.   Neurological: Positive for headaches.    Social History   Tobacco Use  . Smoking status: Never Smoker  . Smokeless tobacco: Never Used  Substance Use Topics  . Alcohol use: Yes    Alcohol/week: 0.0 standard drinks    Comment: occasionally      Objective:   BP 107/75 (BP Location: Left Arm, Patient Position: Sitting, Cuff Size: Normal)   Pulse (!) 121   Temp 98.5 F (36.9 C) (Oral)   Resp 16   Wt 202 lb (91.6  kg)   LMP 10/15/2018   SpO2 98%   BMI 32.60 kg/m  Vitals:   10/19/18 1151  BP: 107/75  Pulse: (!) 121  Resp: 16  Temp: 98.5 F (36.9 C)  TempSrc: Oral  SpO2: 98%  Weight: 202 lb (91.6 kg)     Physical Exam Constitutional:      Appearance: Normal appearance.  HENT:     Right Ear: Tympanic membrane and ear canal normal.     Left Ear: Tympanic membrane and ear canal normal.     Nose: Nose normal.     Mouth/Throat:     Mouth: Mucous membranes are moist.     Pharynx: Oropharynx is clear.  Cardiovascular:     Rate and Rhythm: Normal rate and regular rhythm.  Pulmonary:     Effort: Pulmonary effort is normal.     Breath sounds: Wheezing and rhonchi present.  Skin:    General: Skin is warm and dry.  Neurological:     General: No focal deficit present.     Mental Status: She is alert and oriented to person, place, and time.  Psychiatric:        Mood and Affect: Mood normal.        Behavior: Behavior normal.         Assessment & Plan    1. Bronchitis  - predniSONE (STERAPRED UNI-PAK 21 TAB) 10  MG (21) TBPK tablet; Take 6 pills on day 1, take 5 pills on day 2 and so on until complete.  Dispense: 21 tablet; Refill: 0 - albuterol (PROVENTIL HFA;VENTOLIN HFA) 108 (90 Base) MCG/ACT inhaler; Inhale 2 puffs into the lungs every 6 (six) hours as needed for wheezing or shortness of breath.  Dispense: 1 Inhaler; Refill: 0  Return if symptoms worsen or fail to improve.  The entirety of the information documented in the History of Present Illness, Review of Systems and Physical Exam were personally obtained by me. Portions of this information were initially documented by Rondel Baton, CMA and reviewed by me for thoroughness and accuracy.       Trey Sailors, PA-C  [facility]

## 2018-12-14 ENCOUNTER — Other Ambulatory Visit: Payer: Self-pay

## 2018-12-14 ENCOUNTER — Other Ambulatory Visit: Payer: 59

## 2018-12-14 DIAGNOSIS — Z131 Encounter for screening for diabetes mellitus: Secondary | ICD-10-CM | POA: Diagnosis not present

## 2018-12-14 DIAGNOSIS — Z1321 Encounter for screening for nutritional disorder: Secondary | ICD-10-CM

## 2018-12-14 DIAGNOSIS — Z0000 Encounter for general adult medical examination without abnormal findings: Secondary | ICD-10-CM | POA: Diagnosis not present

## 2018-12-14 DIAGNOSIS — Z13220 Encounter for screening for lipoid disorders: Secondary | ICD-10-CM

## 2018-12-15 LAB — COMPREHENSIVE METABOLIC PANEL
ALBUMIN: 4.5 g/dL (ref 3.8–4.8)
ALT: 8 IU/L (ref 0–32)
AST: 14 IU/L (ref 0–40)
Albumin/Globulin Ratio: 2.5 — ABNORMAL HIGH (ref 1.2–2.2)
Alkaline Phosphatase: 68 IU/L (ref 39–117)
BUN / CREAT RATIO: 12 (ref 9–23)
BUN: 11 mg/dL (ref 6–20)
Bilirubin Total: 0.5 mg/dL (ref 0.0–1.2)
CALCIUM: 8.8 mg/dL (ref 8.7–10.2)
CHLORIDE: 107 mmol/L — AB (ref 96–106)
CO2: 21 mmol/L (ref 20–29)
Creatinine, Ser: 0.91 mg/dL (ref 0.57–1.00)
GFR calc non Af Amer: 80 mL/min/{1.73_m2} (ref >59)
GFR, EST AFRICAN AMERICAN: 92 mL/min/{1.73_m2} (ref >59)
GLUCOSE: 86 mg/dL (ref 65–99)
Globulin, Total: 1.8 g/dL (ref 1.5–4.5)
Potassium: 4.4 mmol/L (ref 3.5–5.2)
Sodium: 141 mmol/L (ref 134–144)
TOTAL PROTEIN: 6.3 g/dL (ref 6.0–8.5)

## 2018-12-15 LAB — LIPID PANEL WITH LDL/HDL RATIO
Cholesterol, Total: 171 mg/dL (ref 100–199)
HDL: 48 mg/dL (ref >39)
LDL Calculated: 109 mg/dL — ABNORMAL HIGH (ref 0–99)
LDl/HDL Ratio: 2.3 ratio (ref 0.0–3.2)
Triglycerides: 69 mg/dL (ref 0–149)
VLDL Cholesterol Cal: 14 mg/dL (ref 5–40)

## 2018-12-15 LAB — HGB A1C W/O EAG: Hgb A1c MFr Bld: 5.3 % (ref 4.8–5.6)

## 2018-12-15 LAB — VITAMIN D 25 HYDROXY (VIT D DEFICIENCY, FRACTURES): Vit D, 25-Hydroxy: 13.2 ng/mL — ABNORMAL LOW (ref 30.0–100.0)

## 2018-12-15 NOTE — Progress Notes (Signed)
Pt aware.

## 2018-12-15 NOTE — Progress Notes (Signed)
Pls let pt know labs normal except Vit D deficiency. Needs to do Vit D3 5,000 IU daily. Can get OTC. Annual due 5/20.

## 2018-12-20 ENCOUNTER — Other Ambulatory Visit: Payer: Self-pay

## 2018-12-20 ENCOUNTER — Ambulatory Visit
Admission: RE | Admit: 2018-12-20 | Discharge: 2018-12-20 | Disposition: A | Discharge status: Home / Self Care | Payer: 59 | Source: Ambulatory Visit | Attending: Obstetrics and Gynecology | Admitting: Obstetrics and Gynecology

## 2018-12-20 DIAGNOSIS — Z1239 Encounter for other screening for malignant neoplasm of breast: Secondary | ICD-10-CM

## 2018-12-20 DIAGNOSIS — Z1231 Encounter for screening mammogram for malignant neoplasm of breast: Secondary | ICD-10-CM | POA: Diagnosis not present

## 2018-12-22 ENCOUNTER — Encounter: Payer: Self-pay | Admitting: Obstetrics and Gynecology

## 2019-07-17 ENCOUNTER — Other Ambulatory Visit: Payer: Self-pay

## 2019-07-17 DIAGNOSIS — Z20822 Contact with and (suspected) exposure to covid-19: Secondary | ICD-10-CM

## 2019-07-18 LAB — NOVEL CORONAVIRUS, NAA: SARS-CoV-2, NAA: NOT DETECTED

## 2019-09-13 NOTE — Progress Notes (Signed)
Briana Cummings, Briana Evener, PA-C   Chief Complaint  Patient presents with  . Vaginal Discharge    blue/green discharge, no odor/itchiness/irration x 1 month    HPI:      Briana Cummings is a 38 y.o. G0P0000 who LMP was Patient's last menstrual period was 08/31/2019 (approximate)., presents today for blue/green neon d/c in pantyliner and underwear for past month. No vag itch/irritation/odor. No meds to treat. No urin sx. Hx of kidney stones with back pain for 1 day, resolved after drinking lots of water. Not sex active. Was on doxy for rosacea but stopped Rx and no sx change. Uses pink body wash.  Annual past due. Hx of HPV a few yrs ago with recent neg paps/neg HPV DNA, last one 5/19.   Patient Active Problem List   Diagnosis Date Noted  . Family history of colon cancer 02/17/2017  . 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 Breast mass, right 11/21/2014    Past Surgical History:  Procedure Laterality Date  . BREAST BIOPSY Right 11-21-14   PSEUDOANGIOMATOUS STROMAL HYPERPLASIA  . BREAST LUMPECTOMY Right 2017  . COLONOSCOPY  2011   because of family history  . LITHOTRIPSY    . WISDOM TOOTH EXTRACTION      Family History  Problem Relation Age of Onset  . Colon cancer Mother 81       genetic negative  . Cancer Paternal Grandmother        lung primary, extensive mets  . Cancer Paternal Grandfather        lung  . Heart attack Paternal Grandfather   . Breast cancer Paternal Aunt 93  . Prostate cancer Maternal Uncle     Social History   Socioeconomic History  . Marital status: Married    Spouse name: Not on file  . Number of children: Not on file  . Years of education: Not on file  . Highest education level: Not on file  Occupational History  . Not on file  Tobacco Use  . Smoking status: Never Smoker  . Smokeless tobacco: Never Used  Substance and Sexual Activity  . Alcohol use: Yes    Alcohol/week: 0.0 standard drinks    Comment: occasionally   . Drug use: No  . Sexual activity: Yes    Birth control/protection: None  Other Topics Concern  . Not on file  Social History Narrative  . Not on file   Social Determinants of Health   Financial Resource Strain:   . Difficulty of Paying Living Expenses: Not on file  Food Insecurity:   . Worried About Charity fundraiser in the Last Year: Not on file  . Ran Out of Food in the Last Year: Not on file  Transportation Needs:   . Lack of Transportation (Medical): Not on file  . Lack of Transportation (Non-Medical): Not on file  Physical Activity: Unknown  . Days of Exercise per Week: 0 days  . Minutes of Exercise per Session: Not on file  Stress:   . Feeling of Stress : Not on file  Social Connections:   . Frequency of Communication with Friends and Family: Not on file  . Frequency of Social Gatherings with Friends and Family: Not on file  . Attends Religious Services: Not on file  . Active Member of Clubs or Organizations: Not on file  . Attends Archivist Meetings: Not on file  . Marital Status: Not on file  Intimate Partner Violence:   .  Fear of Current or Ex-Partner: Not on file  . Emotionally Abused: Not on file  . Physically Abused: Not on file  . Sexually Abused: Not on file    Outpatient Medications Prior to Visit  Medication Sig Dispense Refill  . diphenhydramine-acetaminophen (TYLENOL PM) 25-500 MG TABS tablet Take 1 tablet by mouth at bedtime as needed.    . metroNIDAZOLE (METROGEL) 0.75 % gel APPLY TO AFFECTED AREA TWICE A DAY TO FACE    . SOOLANTRA 1 % CREA Apply  a small amount to skin every evening    . doxycycline (MONODOX) 100 MG capsule Take 100 mg by mouth daily.    Marland Kitchen albuterol (PROVENTIL HFA;VENTOLIN HFA) 108 (90 Base) MCG/ACT inhaler Inhale 2 puffs into the lungs every 6 (six) hours as needed for wheezing or shortness of breath. 1 Inhaler 0  . predniSONE (STERAPRED UNI-PAK 21 TAB) 10 MG (21) TBPK tablet Take 6 pills on day 1, take 5 pills on day  2 and so on until complete. 21 tablet 0   No facility-administered medications prior to visit.      ROS:  Review of Systems  Constitutional: Negative for fever.  Gastrointestinal: Negative for blood in stool, constipation, diarrhea, nausea and vomiting.  Genitourinary: Positive for vaginal discharge. Negative for dyspareunia, dysuria, flank pain, frequency, hematuria, urgency, vaginal bleeding and vaginal pain.  Musculoskeletal: Negative for back pain.  Skin: Negative for rash.    OBJECTIVE:   Vitals:  BP 110/70   Ht 5\' 6"  (1.676 m)   Wt 196 lb (88.9 kg)   LMP 08/31/2019 (Approximate)   BMI 31.64 kg/m   Physical Exam Vitals reviewed.  Constitutional:      Appearance: She is well-developed.  Pulmonary:     Effort: Pulmonary effort is normal.  Genitourinary:    General: Normal vulva.     Pubic Area: No rash.      Labia:        Right: No rash, tenderness or lesion.        Left: No rash, tenderness or lesion.      Vagina: Normal. No vaginal discharge, erythema or tenderness.     Cervix: Normal.     Uterus: Normal. Not enlarged and not tender.      Adnexa: Right adnexa normal and left adnexa normal.       Right: No mass or tenderness.         Left: No mass or tenderness.       Comments: VAG D/C IS WHITE (NO EVID OF BLUE/GREEN) Musculoskeletal:        General: Normal range of motion.     Cervical back: Normal range of motion.  Skin:    General: Skin is warm and dry.  Neurological:     General: No focal deficit present.     Mental Status: She is alert and oriented to person, place, and time.  Psychiatric:        Mood and Affect: Mood normal.        Behavior: Behavior normal.        Thought Content: Thought content normal.        Judgment: Judgment normal.     Results: Results for orders placed or performed in visit on 09/14/19 (from the past 24 hour(s))  POCT Wet Prep with KOH     Status: Normal   Collection Time: 09/14/19 11:39 AM  Result Value Ref Range    Trichomonas, UA Negative    Clue Cells Wet Prep HPF  POC neg    Epithelial Wet Prep HPF POC     Yeast Wet Prep HPF POC neg    Bacteria Wet Prep HPF POC     RBC Wet Prep HPF POC     WBC Wet Prep HPF POC     KOH Prep POC Negative Negative     Assessment/Plan: Vaginal discharge - Plan: POCT Wet Prep with KOH; Neg wet prep/exam. Vag d/c is white, pt sees it on Qtip and agrees. Question dye/brighteners in detergent. Change to sens skin soap and detergent. No other vag sx. F/u prn.      Return in about 4 weeks (around 10/12/2019) for annual.  Shauna Bodkins B. Atalie Oros, PA-C 09/14/2019 11:41 AM

## 2019-09-14 ENCOUNTER — Ambulatory Visit (INDEPENDENT_AMBULATORY_CARE_PROVIDER_SITE_OTHER): Payer: 59 | Admitting: Obstetrics and Gynecology

## 2019-09-14 ENCOUNTER — Other Ambulatory Visit: Payer: Self-pay

## 2019-09-14 ENCOUNTER — Encounter: Payer: Self-pay | Admitting: Obstetrics and Gynecology

## 2019-09-14 VITALS — BP 110/70 | Ht 66.0 in | Wt 196.0 lb

## 2019-09-14 DIAGNOSIS — N898 Other specified noninflammatory disorders of vagina: Secondary | ICD-10-CM | POA: Diagnosis not present

## 2019-09-14 LAB — POCT WET PREP WITH KOH
Clue Cells Wet Prep HPF POC: NEGATIVE
KOH Prep POC: NEGATIVE
Trichomonas, UA: NEGATIVE
Yeast Wet Prep HPF POC: NEGATIVE

## 2019-09-14 NOTE — Patient Instructions (Signed)
I value your feedback and entrusting us with your care. If you get a [facility] patient survey, I would appreciate you taking the time to let us know about your experience today. Thank you!  As of September 14, 2019, your lab results will be released to your MyChart immediately, before I even have a chance to see them. Please give me time to review them and contact you if there are any abnormalities. Thank you for your patience.  

## 2019-10-05 ENCOUNTER — Ambulatory Visit: Payer: 59 | Attending: Internal Medicine

## 2019-10-05 ENCOUNTER — Other Ambulatory Visit: Payer: Self-pay

## 2019-10-05 DIAGNOSIS — Z20822 Contact with and (suspected) exposure to covid-19: Secondary | ICD-10-CM

## 2019-10-07 LAB — NOVEL CORONAVIRUS, NAA: SARS-CoV-2, NAA: NOT DETECTED

## 2019-10-19 ENCOUNTER — Ambulatory Visit: Payer: 59 | Admitting: Obstetrics and Gynecology

## 2020-08-16 IMAGING — MG DIGITAL SCREENING BILATERAL MAMMOGRAM WITH TOMO AND CAD
8 series · 8 of 24 positions shown · non-contrast
Comparison: Previous exam(s).

CLINICAL DATA: Screening.

EXAM:
DIGITAL SCREENING BILATERAL MAMMOGRAM WITH TOMO AND CAD

[R CC synth-2D]
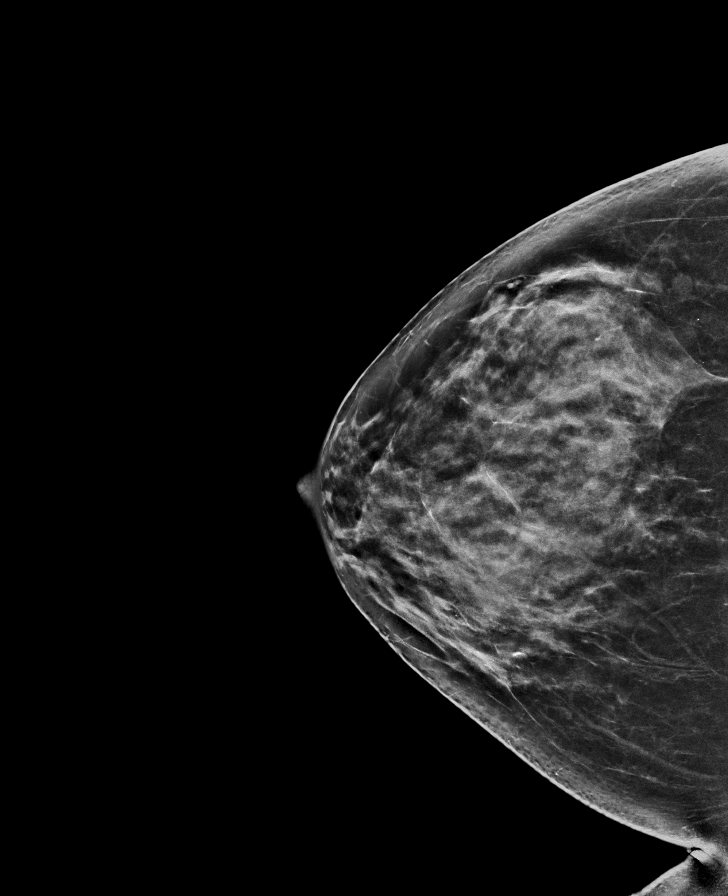

[L MLO synth-2D]
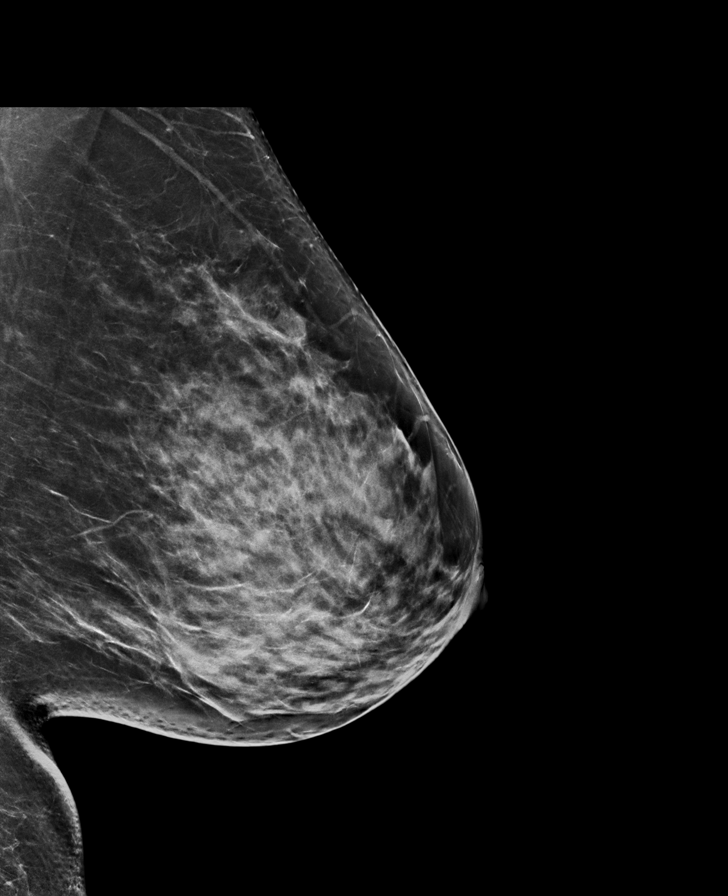

[L CC synth-2D]
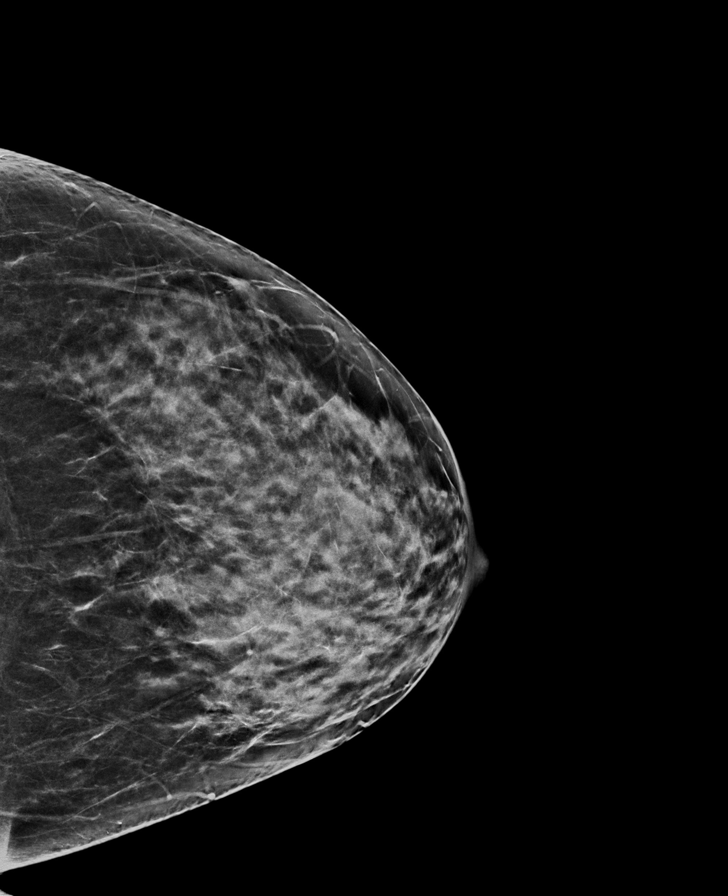

[R MLO synth-2D]
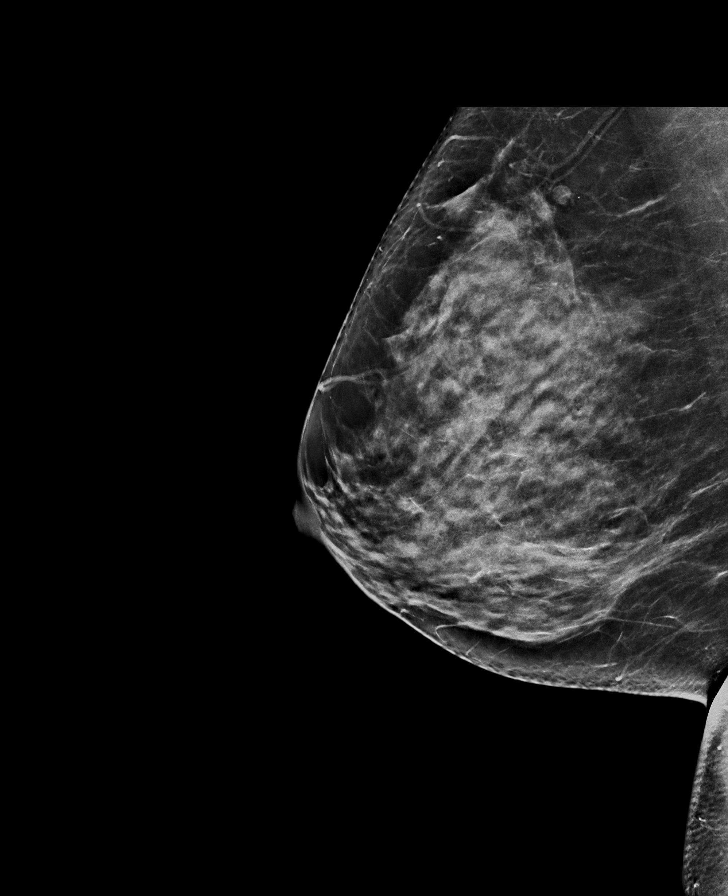

[L MLO tomo · tomo slice 39/78.0]
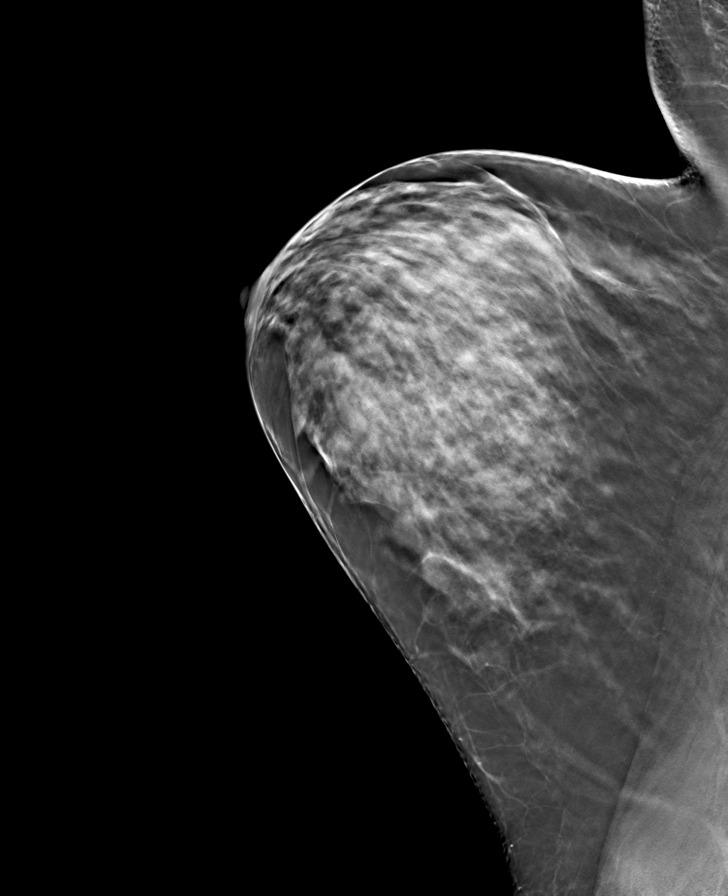

[L CC tomo · tomo slice 35/70.0]
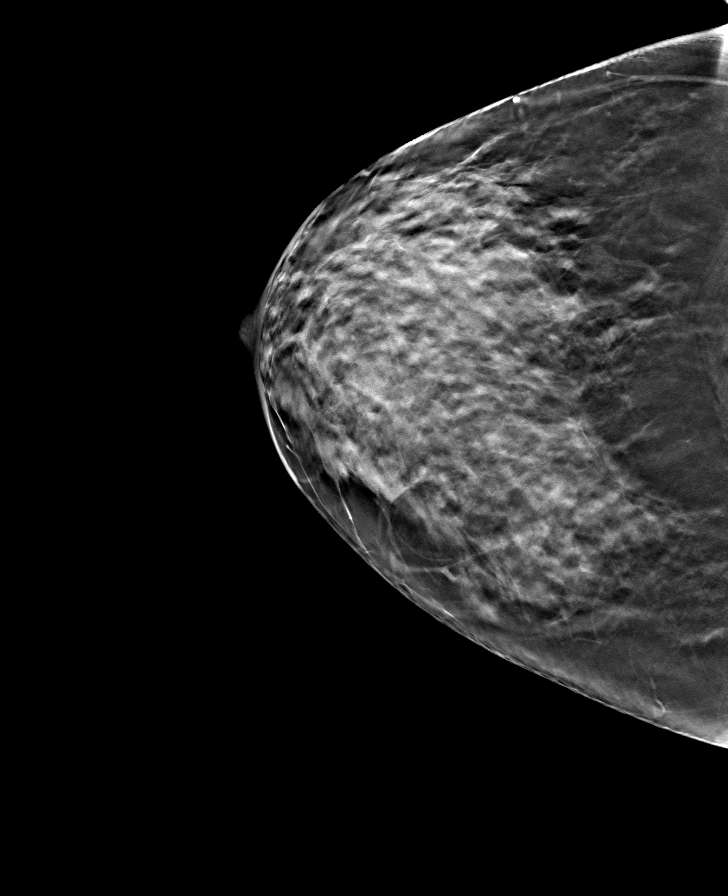

[R MLO tomo · tomo slice 37/73.0]
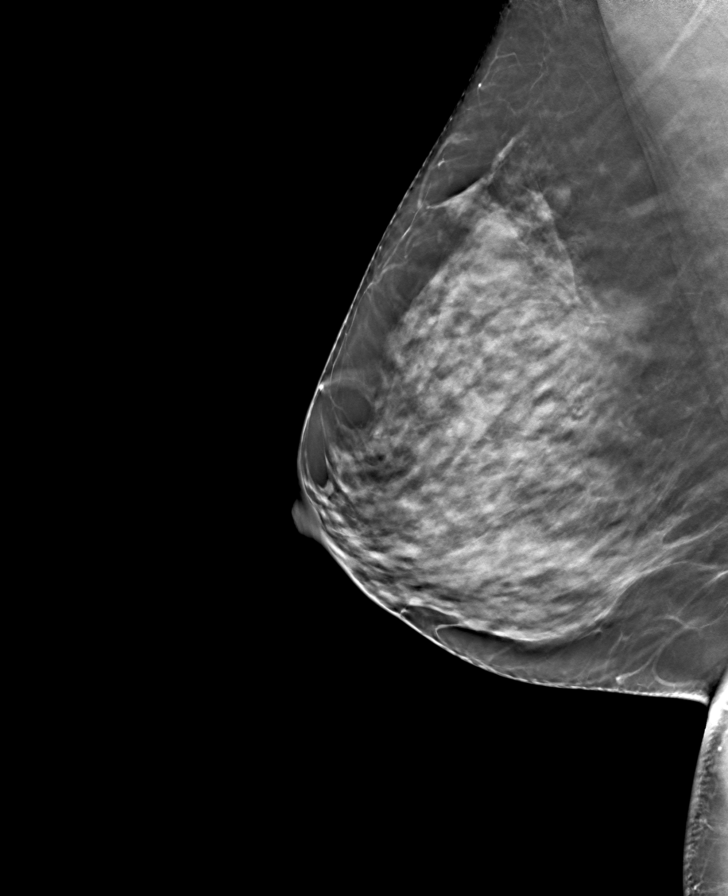

[R CC tomo · tomo slice 37/72.0]
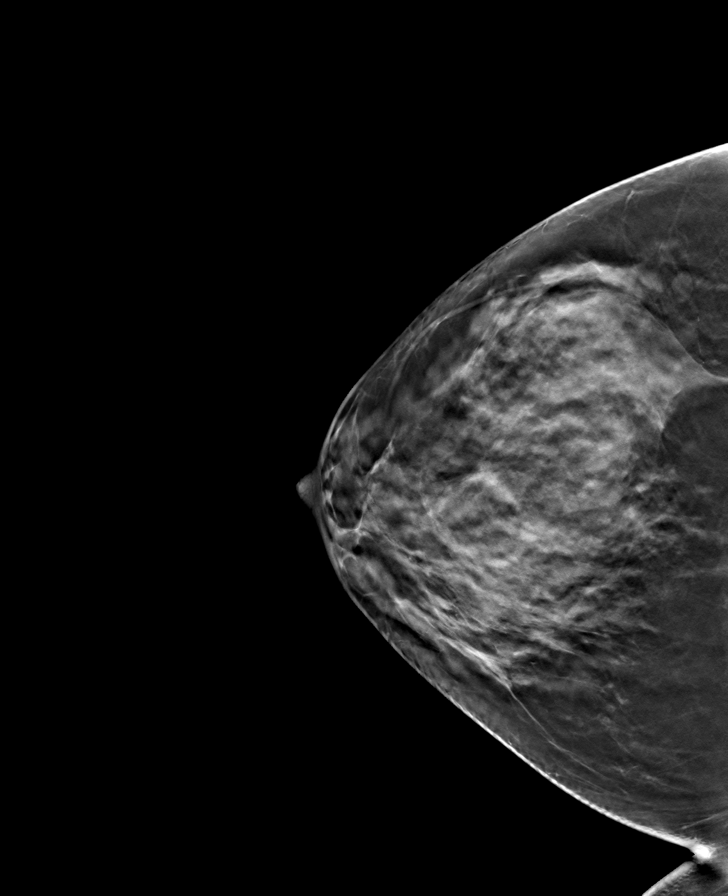

[8 of 24 positions shown; findings below may reference images not displayed]

ACR Breast Density Category c: The breast tissue is heterogeneously
dense, which may obscure small masses.
FINDINGS: There are no findings suspicious for malignancy. Images were
processed with CAD.
IMPRESSION: No mammographic evidence of malignancy. A result letter of this
screening mammogram will be mailed directly to the patient.

RECOMMENDATION:
Screening mammogram in one year. (Code:FT-U-LHB)

BI-RADS CATEGORY  1: Negative.

## 2020-11-29 ENCOUNTER — Ambulatory Visit: Payer: 59 | Admitting: Obstetrics and Gynecology

## 2020-12-16 ENCOUNTER — Other Ambulatory Visit: Payer: Self-pay

## 2020-12-16 ENCOUNTER — Ambulatory Visit (INDEPENDENT_AMBULATORY_CARE_PROVIDER_SITE_OTHER): Payer: 59 | Admitting: Obstetrics and Gynecology

## 2020-12-16 ENCOUNTER — Encounter: Payer: Self-pay | Admitting: Obstetrics and Gynecology

## 2020-12-16 ENCOUNTER — Other Ambulatory Visit (HOSPITAL_COMMUNITY)
Admission: RE | Admit: 2020-12-16 | Discharge: 2020-12-16 | Disposition: A | Discharge status: Home / Self Care | Payer: 59 | Source: Ambulatory Visit | Attending: Obstetrics and Gynecology | Admitting: Obstetrics and Gynecology

## 2020-12-16 VITALS — BP 138/70 | Ht 66.0 in | Wt 202.6 lb

## 2020-12-16 DIAGNOSIS — Z124 Encounter for screening for malignant neoplasm of cervix: Secondary | ICD-10-CM | POA: Diagnosis present

## 2020-12-16 DIAGNOSIS — Z800 Family history of malignant neoplasm of digestive organs: Secondary | ICD-10-CM

## 2020-12-16 DIAGNOSIS — N852 Hypertrophy of uterus: Secondary | ICD-10-CM

## 2020-12-16 DIAGNOSIS — Z1211 Encounter for screening for malignant neoplasm of colon: Secondary | ICD-10-CM | POA: Diagnosis not present

## 2020-12-16 DIAGNOSIS — Z0000 Encounter for general adult medical examination without abnormal findings: Secondary | ICD-10-CM | POA: Diagnosis not present

## 2020-12-16 DIAGNOSIS — Z01419 Encounter for gynecological examination (general) (routine) without abnormal findings: Secondary | ICD-10-CM | POA: Diagnosis not present

## 2020-12-16 DIAGNOSIS — Z1231 Encounter for screening mammogram for malignant neoplasm of breast: Secondary | ICD-10-CM | POA: Diagnosis not present

## 2020-12-16 DIAGNOSIS — Z1239 Encounter for other screening for malignant neoplasm of breast: Secondary | ICD-10-CM

## 2020-12-16 DIAGNOSIS — Z01411 Encounter for gynecological examination (general) (routine) with abnormal findings: Secondary | ICD-10-CM

## 2020-12-16 NOTE — Progress Notes (Signed)
Gynecology Annual Exam  PCP: Rica Records, PA-C  Chief Complaint:  Chief Complaint  Patient presents with   Gynecologic Exam    History of Present Illness: Patient is a 40 y.o. G0P0000 presents for annual exam. The patient has no complaints today.   LMP: Patient's last menstrual period was 12/03/2020. Average Interval: regular, 28 days Duration of flow: 5 days Heavy Menses: yes Intermenstrual Bleeding: no Dysmenorrhea: no  The patient does perform self breast exams.  There is notable family history of breast or ovarian cancer in her family.  The patient has regular exercise: starting peloton 3 days a week  The patient reports current symptoms of depression.   PHQ-9: 2 GAD-7: 3   Review of Systems: ROS  Past Medical History:  Past Medical History:  Diagnosis Date   Family history of colon cancer    mom age 47; colonoscopy due age 67   GERD (gastroesophageal reflux disease)    HPV in female    Kidney stones    Rosacea     Past Surgical History:  Past Surgical History:  Procedure Laterality Date   BREAST BIOPSY Right 11-21-14   PSEUDOANGIOMATOUS STROMAL HYPERPLASIA   BREAST LUMPECTOMY Right 2017   COLONOSCOPY  2011   because of family history   LITHOTRIPSY     WISDOM TOOTH EXTRACTION      Gynecologic History:  Patient's last menstrual period was 12/03/2020. Menarche: 7th grade  History of fibroids, polyps, or ovarian cysts? : no  History of PCOS? no Hstory of Endometriosis? no History of abnormal pap smears? yes Have you had any sexually transmitted infections in the past?  yes  Last Pap: Results were: 2019 NIL and HR HPV negative   She identifies as a female. She is sexually active with men.   She denies dyspareunia. She denies postcoital bleeding.    Obstetric History: G0P0000  Family History:  Family History  Problem Relation Age of Onset   Colon cancer Mother 27       genetic negative   Cancer Paternal Grandmother         lung primary, extensive mets   Cancer Paternal Grandfather        lung   Heart attack Paternal Grandfather    Breast cancer Paternal Aunt 94   Prostate cancer Maternal Uncle     Social History:  Social History   Socioeconomic History   Marital status: Married    Spouse name: Not on file   Number of children: Not on file   Years of education: Not on file   Highest education level: Not on file  Occupational History   Not on file  Tobacco Use   Smoking status: Never Smoker   Smokeless tobacco: Never Used  Vaping Use   Vaping Use: Never used  Substance and Sexual Activity   Alcohol use: Yes    Alcohol/week: 0.0 standard drinks    Comment: occasionally   Drug use: No   Sexual activity: Yes    Birth control/protection: None  Other Topics Concern   Not on file  Social History Narrative   Not on file   Social Determinants of Health   Financial Resource Strain: Not on file  Food Insecurity: Not on file  Transportation Needs: Not on file  Physical Activity: Not on file  Stress: Not on file  Social Connections: Not on file  Intimate Partner Violence: Not on file    Allergies:  Allergies  Allergen Reactions   Peanut  Oil Itching   Amoxicillin Rash   Penicillins Rash    Medications: Prior to Admission medications   Medication Sig Start Date End Date Taking? Authorizing Provider  diphenhydramine-acetaminophen (TYLENOL PM) 25-500 MG TABS tablet Take 1 tablet by mouth at bedtime as needed.   Yes [provider]  metroNIDAZOLE (METROGEL) 0.75 % gel APPLY TO AFFECTED AREA TWICE A DAY TO FACE 05/22/19  Yes [provider]  SOOLANTRA 1 % CREA Apply  a small amount to skin every evening 07/25/19  Yes [provider]  doxycycline (MONODOX) 100 MG capsule Take 100 mg by mouth daily. Patient not taking: Reported on 12/16/2020 07/25/19   [provider]    Physical Exam Vitals: Blood pressure 138/70, height 5\' 6"  (1.676  m), weight 202 lb 9.6 oz (91.9 kg), last menstrual period 12/03/2020.  Physical Exam Constitutional:      Appearance: She is well-developed.  Genitourinary:     Genitourinary Comments: External: Normal appearing vulva. No lesions noted.  Speculum examination: Normal appearing cervix. No blood in the vaginal vault. No discharge.   Bimanual examination: Uterus midline, non-tender, enlarged size. Normal shape and contour.  No CMT. No adnexal masses. No adnexal tenderness. Pelvis not fixed.  Breast Exam: breast equal without skin changes, nipple discharge, breast lump or enlarged lymph nodes   HENT:     Head: Normocephalic and atraumatic.  Neck:     Thyroid: No thyromegaly.  Cardiovascular:     Rate and Rhythm: Normal rate and regular rhythm.     Heart sounds: Normal heart sounds.  Pulmonary:     Effort: Pulmonary effort is normal.     Breath sounds: Normal breath sounds.  Abdominal:     General: Bowel sounds are normal. There is no distension.     Palpations: Abdomen is soft. There is no mass.  Musculoskeletal:     Cervical back: Neck supple.  Neurological:     Mental Status: She is alert and oriented to person, place, and time.  Skin:    General: Skin is warm and dry.  Psychiatric:        Behavior: Behavior normal.        Thought Content: Thought content normal.        Judgment: Judgment normal.  Vitals reviewed.      Female chaperone present for pelvic and breast  portions of the physical exam  Assessment: 40 y.o. G0P0000 routine annual exam  Plan: Problem List Items Addressed This Visit      Other   Family history of colon cancer   Relevant Orders   Ambulatory referral to Gastroenterology    Other Visit Diagnoses    Encounter for annual routine gynecological examination    -  Primary   Health maintenance examination       Breast cancer screening by mammogram       Relevant Orders   MM 3D SCREEN BREAST BILATERAL   Colon cancer screening       Relevant Orders    Ambulatory referral to Gastroenterology   Encounter for gynecological examination with abnormal finding       Encounter for screening breast examination       Enlarged uterus       Relevant Orders   44 Pelvis Complete   Cervical cancer screening       Relevant Orders   Cytology - PAP      1) Mammogram - recommend yearly screening mammogram.  Mammogram was ordered today  2) STI  screening was offered and declined  3) ASCCP guidelines and rational discussed.  Patient opts for every 3 years screening interval  4) Contraception - using condoms  5) Colonoscopy -- referral placed.   6) Routine healthcare maintenance including cholesterol, diabetes screening discussed up to date   7) Enlarged uterus, will follow up for pelvic US and GYN visit  8) Elevated BP, repeat at next visit.    Adelene Idler MD, [facility], [facility] 12/16/2020 10:08 AM

## 2020-12-16 NOTE — Patient Instructions (Signed)
Institute of [place] for Calcium and Vitamin D  Age (yr) Calcium Recommended Dietary Allowance (mg/day) Vitamin D Recommended Dietary Allowance (international units/day)  9-18 1,300 600  19-50 1,000 600  51-70 1,200 600  71 and older 1,200 800  Data from Institute of Medicine. Dietary reference intakes: calcium, vitamin D. [place], [place]: [employer]; 2011.    Exercising to Stay Healthy To become healthy and stay healthy, it is recommended that you do moderate-intensity and vigorous-intensity exercise. You can tell that you are exercising at a moderate intensity if your heart starts beating faster and you start breathing faster but can still hold a conversation. You can tell that you are exercising at a vigorous intensity if you are breathing much harder and faster and cannot hold a conversation while exercising. Exercising regularly is important. It has many health benefits, such as:  Improving overall fitness, flexibility, and endurance.  Increasing bone density.  Helping with weight control.  Decreasing body fat.  Increasing muscle strength.  Reducing stress and tension.  Improving overall health. How often should I exercise? Choose an activity that you enjoy, and set realistic goals. Your health care provider can help you make an activity plan that works for you. Exercise regularly as told by your health care provider. This may include:  Doing strength training two times a week, such as: ? Lifting weights. ? Using resistance bands. ? Push-ups. ? Sit-ups. ? Yoga.  Doing a certain intensity of exercise for a given amount of time. Choose from these options: ? A total of 150 minutes of moderate-intensity exercise every week. ? A total of 75 minutes of vigorous-intensity exercise every week. ? A mix of moderate-intensity and vigorous-intensity exercise every week. Children, pregnant women, people who have not exercised  regularly, people who are overweight, and older adults may need to talk with a health care provider about what activities are safe to do. If you have a medical condition, be sure to talk with your health care provider before you start a new exercise program. What are some exercise ideas? Moderate-intensity exercise ideas include:  Walking 1 mile (1.6 km) in about 15 minutes.  Biking.  Hiking.  Golfing.  Dancing.  Water aerobics. Vigorous-intensity exercise ideas include:  Walking 4.5 miles (7.2 km) or more in about 1 hour.  Jogging or running 5 miles (8 km) in about 1 hour.  Biking 10 miles (16.1 km) or more in about 1 hour.  Lap swimming.  Roller-skating or in-line skating.  Cross-country skiing.  Vigorous competitive sports, such as football, basketball, and soccer.  Jumping rope.  Aerobic dancing.   What are some everyday activities that can help me to get exercise?  [place] work, such as: ? Pushing a Conservation officer, nature. ? Raking and bagging leaves.  Washing your car.  Pushing a stroller.  Shoveling snow.  Gardening.  Washing windows or floors. How can I be more active in my day-to-day activities?  Use stairs instead of an elevator.  Take a walk during your lunch break.  If you drive, park your car farther away from your work or school.  If you take public transportation, get off one stop early and walk the rest of the way.  Stand up or walk around during all of your indoor phone calls.  Get up, stretch, and walk around every 30 minutes throughout the day.  Enjoy exercise with a friend. Support to continue exercising will help you keep a regular routine of activity. What guidelines  can I follow while exercising?  Before you start a new exercise program, talk with your health care provider.  Do not exercise so much that you hurt yourself, feel dizzy, or get very short of breath.  Wear comfortable clothes and wear shoes with good support.  Drink plenty of  water while you exercise to prevent dehydration or heat stroke.  Work out until your breathing and your heartbeat get faster. Where to find more information  U.S. Department of Health and Human Services: www.hhs.gov  Centers for Disease Control and Prevention (CDC): www.cdc.gov Summary  Exercising regularly is important. It will improve your overall fitness, flexibility, and endurance.  Regular exercise also will improve your overall health. It can help you control your weight, reduce stress, and improve your bone density.  Do not exercise so much that you hurt yourself, feel dizzy, or get very short of breath.  Before you start a new exercise program, talk with your health care provider. This information is not intended to replace advice given to you by your health care provider. Make sure you discuss any questions you have with your health care provider. Document Revised: 09/03/2017 Document Reviewed: 08/12/2017 Elsevier Patient Education  2021 Elsevier Inc.   Budget-Friendly Healthy Eating There are many ways to save money at the grocery store and continue to eat healthy. You can be successful if you:  Plan meals according to your budget.  Make a grocery list and only purchase food according to your grocery list.  Prepare food yourself at home. What are tips for following this plan? Reading food labels  Compare food labels between brand name foods and the store brand. Often the nutritional value is the same, but the store brand is lower cost.  Look for products that do not have added sugar, fat, or salt (sodium). These often cost the same but are healthier for you. Products may be labeled as: ? Sugar-free. ? Nonfat. ? Low-fat. ? Sodium-free. ? Low-sodium.  Look for lean ground beef labeled as at least 92% lean and 8% fat. Shopping  Buy only the items on your grocery list and go only to the areas of the store that have the items on your list.  Use coupons only for  foods and brands you normally buy. Avoid buying items you wouldn't normally buy simply because they are on sale.  Check online and in newspapers for weekly deals.  Buy healthy items from the bulk bins when available, such as herbs, spices, flour, pasta, nuts, and dried fruit.  Buy fruits and vegetables that are in season. Prices are usually lower on in-season produce.  Look at the unit price on the price tag. Use it to compare different brands and sizes to find out which item is the best deal.  Choose healthy items that are often low-cost, such as carrots, potatoes, apples, bananas, and oranges. Dried or canned beans are a low-cost protein source.  Buy in bulk and freeze extra food. Items you can buy in bulk include meats, fish, poultry, frozen fruits, and frozen vegetables.  Avoid buying "ready-to-eat" foods, such as pre-cut fruits and vegetables and pre-made salads.  If possible, shop around to discover where you can find the best prices. Consider other retailers such as dollar stores, larger wholesale stores, local fruit and vegetable stands, and farmers markets.  Do not shop when you are hungry. If you shop while hungry, it may be hard to stick to your list and budget.  Resist impulse buying. Use your grocery   list as your official plan for the week.  Buy a variety of vegetables and fruits by purchasing fresh, frozen, and canned items.  Look at the top and bottom shelves for deals. Foods at eye level (eye level of an adult or child) are usually more expensive.  Be efficient with your time when shopping. The more time you spend at the store, the more money you are likely to spend.  To save money when choosing more expensive foods like meats and dairy: ? Choose cheaper cuts of meat, such as bone-in chicken thighs and drumsticks instead of skinless and boneless chicken. When you are ready to prepare the chicken, you can remove the skin yourself to make it healthier. ? Choose lean meats  like chicken or [place] instead of beef. ? Choose canned seafood, such as tuna, salmon, or sardines. ? Buy eggs as a low-cost source of protein. ? Buy dried beans and peas, such as lentils, split peas, or kidney beans instead of meats. Dried beans and peas are a good alternative source of protein. ? Buy the larger tubs of yogurt instead of individual-sized containers.  Choose water instead of sodas and other sweetened beverages.  Avoid buying chips, cookies, and other "junk food." These items are usually expensive and not healthy.   Cooking  Make extra food and freeze the extras in meal-sized containers or in individual portions for fast meals and snacks.  Pre-cook on days when you have extra time to prepare meals in advance. You can keep these meals in the fridge or freezer and reheat for a quick meal.  When you come home from the grocery store, wash, peel, and cut fruits and vegetables so they are ready to use and eat. This will help reduce food waste. Meal planning  Do not eat out or get fast food. Prepare food at home.  Make a grocery list and make sure to bring it with you to the store. If you have a smart phone, you could use your phone to create your shopping list.  Plan meals and snacks according to a grocery list and budget you create.  Use leftovers in your meal plan for the week.  Look for recipes where you can cook once and make enough food for two meals.  Prepare budget-friendly types of meals like stews, casseroles, and stir-fry dishes.  Try some meatless meals or try "no cook" meals like salads.  Make sure that half your plate is filled with fruits or vegetables. Choose from fresh, frozen, or canned fruits and vegetables. If eating canned, remember to rinse them before eating. This will remove any excess salt added for packaging. Summary  Eating healthy on a budget is possible if you plan your meals according to your budget, purchase according to your budget and  grocery list, and prepare food yourself.  Tips for buying more food on a limited budget include buying generic brands, using coupons only for foods you normally buy, and buying healthy items from the bulk bins when available.  Tips for buying cheaper food to replace expensive food include choosing cheaper, lean cuts of meat, and buying dried beans and peas. This information is not intended to replace advice given to you by your health care provider. Make sure you discuss any questions you have with your health care provider. Document Revised: 07/04/2020 Document Reviewed: 07/04/2020 Elsevier Patient Education  [place] protect organs, store calcium, anchor muscles, and support the whole body. Keeping your bones  strong is important, especially as you get older. You can take actions to help keep your bones strong and healthy. Why is keeping my bones healthy important? Keeping your bones healthy is important because your body constantly replaces bone cells. Cells get old, and new cells take their place. As we age, we lose bone cells because the body may not be able to make enough new cells to replace the old cells. The amount of bone cells and bone tissue you have is referred to as bone mass. The higher your bone mass, the stronger your bones. The aging process leads to an overall loss of bone mass in the body, which can increase the likelihood of:  Joint pain and stiffness.  Broken bones.  A condition in which the bones become weak and brittle (osteoporosis). A large decline in bone mass occurs in older adults. In women, it occurs about the time of menopause.   What actions can I take to keep my bones healthy? Good health habits are important for maintaining healthy bones. This includes eating nutritious foods and exercising regularly. To have healthy bones, you need to get enough of the right minerals and vitamins. Most nutrition experts recommend getting these  nutrients from the foods that you eat. In some cases, taking supplements may also be recommended. Doing certain types of exercise is also important for bone health. What are the nutritional recommendations for healthy bones? Eating a well-balanced diet with plenty of calcium and vitamin D will help to protect your bones. Nutritional recommendations vary from person to person. Ask your health care provider what is healthy for you. Here are some general guidelines. Get enough calcium Calcium is the most important (essential) mineral for bone health. Most people can get enough calcium from their diet, but supplements may be recommended for people who are at risk for osteoporosis. Good sources of calcium include:  Dairy products, such as low-fat or nonfat milk, cheese, and yogurt.  Dark green leafy vegetables, such as bok choy and broccoli.  Calcium-fortified foods, such as orange juice, cereal, bread, soy beverages, and tofu products.  Nuts, such as almonds. Follow these recommended amounts for daily calcium intake:  Children, age 1-3: 700 mg.  Children, age 4-8: 1,000 mg.  Children, age 9-13: 1,300 mg.  Teens, age 14-18: 1,300 mg.  Adults, age 19-50: 1,000 mg.  Adults, age 51-70: ? Men: 1,000 mg. ? Women: 1,200 mg.  Adults, age 71 or older: 1,200 mg.  Pregnant and breastfeeding females: ? Teens: 1,300 mg. ? Adults: 1,000 mg. Get enough vitamin D Vitamin D is the most essential vitamin for bone health. It helps the body absorb calcium. Sunlight stimulates the skin to make vitamin D, so be sure to get enough sunlight. If you live in a cold climate or you do not get outside often, your health care provider may recommend that you take vitamin D supplements. Good sources of vitamin D in your diet include:  Egg yolks.  Saltwater fish.  Milk and cereal fortified with vitamin D. Follow these recommended amounts for daily vitamin D intake:  Children and teens, age 1-18: 600  international units.  Adults, age 50 or younger: 400-800 international units.  Adults, age 51 or older: 800-1,000 international units. Get other important nutrients Other nutrients that are important for bone health include:  Phosphorus. This mineral is found in meat, poultry, dairy foods, nuts, and legumes. The recommended daily intake for adult men and adult women is 700 mg.  Magnesium. This mineral   is found in seeds, nuts, dark green vegetables, and legumes. The recommended daily intake for adult men is 400-420 mg. For adult women, it is 310-320 mg.  Vitamin K. This vitamin is found in green leafy vegetables. The recommended daily intake is 120 mg for adult men and 90 mg for adult women.   What type of physical activity is best for building and maintaining healthy bones? Weight-bearing and strength-building activities are important for building and maintaining healthy bones. Weight-bearing activities cause muscles and bones to work against gravity. Strength-building activities increase the strength of the muscles that support bones. Weight-bearing and muscle-building activities include:  Walking and hiking.  Jogging and running.  Dancing.  Gym exercises.  Lifting weights.  Tennis and racquetball.  Climbing stairs.  Aerobics. Adults should get at least 30 minutes of moderate physical activity on most days. Children should get at least 60 minutes of moderate physical activity on most days. Ask your health care provider what type of exercise is best for you.   How can I find out if my bone mass is low? Bone mass can be measured with an X-ray test called a bone mineral density (BMD) test. This test is recommended for all women who are age 67 or older. It may also be recommended for:  Men who are age 23 or older.  People who are at risk for osteoporosis because of: ? Having bones that break easily. ? Having a long-term disease that weakens bones, such as kidney disease or  rheumatoid arthritis. ? Having menopause earlier than normal. ? Taking medicine that weakens bones, such as steroids, thyroid hormones, or hormone treatment for breast cancer or prostate cancer. ? Smoking. ? Drinking three or more alcoholic drinks a day. If you find that you have a low bone mass, you may be able to prevent osteoporosis or further bone loss by changing your diet and lifestyle. Where can I find more information? For more information, check out the following websites:  National Osteoporosis Foundation: https://carlson-fletcher.info/  [employer]: www.bones.http://www.myers.net/  International Osteoporosis Foundation: Investment banker, operational.iofbonehealth.org Summary  The aging process leads to an overall loss of bone mass in the body, which can increase the likelihood of broken bones and osteoporosis.  Eating a well-balanced diet with plenty of calcium and vitamin D will help to protect your bones.  Weight-bearing and strength-building activities are also important for building and maintaining strong bones.  Bone mass can be measured with an X-ray test called a bone mineral density (BMD) test. This information is not intended to replace advice given to you by your health care provider. Make sure you discuss any questions you have with your health care provider. Document Revised: 10/18/2017 Document Reviewed: 10/18/2017 Elsevier Patient Education  2021 Elsevier Inc.   Uterine Fibroids  Uterine fibroids, also called leiomyomas, are noncancerous (benign) tumors that can grow in the uterus. They can cause heavy menstrual bleeding and pain. Fibroids may also grow in the fallopian tubes, cervix, or tissues (ligaments) near the uterus. You may have one or many fibroids. Fibroids vary in size, weight, and where they grow in the uterus. Some can become quite large. Most fibroids do not require medical treatment. What are the causes? The cause of this condition is not known. What increases the  risk? You are more likely to develop this condition if you:  Are in your 30s or 40s and have not gone through menopause.  Have a family history of this condition.  Are of African American descent.  Started your menstrual period at age 45 or younger.  Have never given birth.  Are overweight or obese. What are the signs or symptoms? Many women do not have any symptoms. Symptoms of this condition may include:  Heavy menstrual bleeding.  Bleeding between menstrual periods.  Pain and pressure in the pelvic area, between your hip bones.  Pain during sex.  Bladder problems, such as needing to urinate right away or more often than usual.  Inability to have children (infertility).  Failure to carry pregnancy to term (miscarriage). How is this diagnosed? This condition may be diagnosed based on:  Your symptoms and medical history.  A physical exam.  A pelvic exam that includes feeling for any tumors.  Imaging tests, such as ultrasound or MRI. How is this treated? Treatment for this condition may include follow-up visits with your health care provider to monitor your fibroids for any changes. Other treatment may include:  Medicines, such as: ? Medicines to relieve pain, including aspirin and NSAIDs, such as ibuprofen or naproxen. ? Hormone therapy. Treatment may be given as a pill or an injection, or it may be inserted into the uterus using an intrauterine device (IUD).  Surgery that would do one of the following: ? Remove the fibroids (myomectomy). This may be recommended if fibroids affect your fertility and you want to become pregnant. ? Remove the uterus (hysterectomy). ? Block the blood supply to the fibroids (uterine artery embolization). This can cause them to shrink and die. Follow these instructions at home: Medicines  Take over-the-counter and prescription medicines only as told by your health care provider.  Ask your health care provider if you should take iron  pills or eat more iron-rich foods, such as dark green, leafy vegetables. Heavy menstrual bleeding can cause low iron levels. Managing pain If directed, apply heat to your back or abdomen to reduce pain. Use the heat source that your health care provider recommends, such as a moist heat pack or a heating pad. To apply heat:  Place a towel between your skin and the heat source.  Leave the heat on for 20-30 minutes.  Remove the heat if your skin turns bright red. This is especially important if you are unable to feel pain, heat, or cold. You may have a greater risk of getting burned.   General instructions  Pay close attention to your menstrual cycle. Tell your health care provider about any changes, such as: ? Heavier bleeding that requires you to change your pads or tampons more than usual. ? A change in the number of days that your menstrual period lasts. ? A change in symptoms that come with your menstrual period, such as back pain or cramps in your abdomen.  Keep all follow-up visits. This is important, especially if your fibroids need to be monitored for any changes. Contact a health care provider if you:  Have pelvic pain, back pain, or cramps in your abdomen that do not get better with medicine or heat.  Develop new bleeding between menstrual periods.  Have increased bleeding during or between menstrual periods.  Feel more tired or weak than usual.  Feel light-headed. Get help right away if you:  Faint.  Have pelvic pain that suddenly gets worse.  Have severe vaginal bleeding that soaks a tampon or pad in 30 minutes or less. Summary  Uterine fibroids are noncancerous (benign) tumors that can develop in the uterus.  The exact cause of this condition is not known.  Most fibroids do  not require medical treatment unless they affect your ability to have children (fertility).  Contact a health care provider if you have pelvic pain, back pain, or cramps in your abdomen that  do not get better with medicines.  Get help right away if you faint, have pelvic pain that suddenly gets worse, or have severe vaginal bleeding. This information is not intended to replace advice given to you by your health care provider. Make sure you discuss any questions you have with your health care provider. Document Revised: 04/23/2020 Document Reviewed: 04/23/2020 Elsevier Patient Education  2021 [employer].

## 2020-12-19 LAB — CYTOLOGY - PAP
Comment: NEGATIVE
Diagnosis: NEGATIVE
High risk HPV: NEGATIVE

## 2020-12-19 NOTE — Telephone Encounter (Signed)
Called and l/m for pt to adv that her u/s follow up appt is scheduled for 01/16/21 at 10:50 w [facility].

## 2021-01-08 ENCOUNTER — Telehealth (INDEPENDENT_AMBULATORY_CARE_PROVIDER_SITE_OTHER): Payer: Self-pay | Admitting: Gastroenterology

## 2021-01-08 ENCOUNTER — Other Ambulatory Visit: Payer: Self-pay

## 2021-01-08 DIAGNOSIS — R599 Enlarged lymph nodes, unspecified: Secondary | ICD-10-CM | POA: Insufficient documentation

## 2021-01-08 DIAGNOSIS — R519 Headache, unspecified: Secondary | ICD-10-CM | POA: Insufficient documentation

## 2021-01-08 DIAGNOSIS — K219 Gastro-esophageal reflux disease without esophagitis: Secondary | ICD-10-CM | POA: Insufficient documentation

## 2021-01-08 DIAGNOSIS — B2790 Infectious mononucleosis, unspecified without complication: Secondary | ICD-10-CM | POA: Insufficient documentation

## 2021-01-08 DIAGNOSIS — J40 Bronchitis, not specified as acute or chronic: Secondary | ICD-10-CM | POA: Insufficient documentation

## 2021-01-08 DIAGNOSIS — Z87442 Personal history of urinary calculi: Secondary | ICD-10-CM | POA: Insufficient documentation

## 2021-01-08 DIAGNOSIS — J309 Allergic rhinitis, unspecified: Secondary | ICD-10-CM | POA: Insufficient documentation

## 2021-01-08 DIAGNOSIS — Z1211 Encounter for screening for malignant neoplasm of colon: Secondary | ICD-10-CM

## 2021-01-08 DIAGNOSIS — Z87898 Personal history of other specified conditions: Secondary | ICD-10-CM | POA: Insufficient documentation

## 2021-01-08 DIAGNOSIS — N200 Calculus of kidney: Secondary | ICD-10-CM | POA: Insufficient documentation

## 2021-01-08 DIAGNOSIS — N3090 Cystitis, unspecified without hematuria: Secondary | ICD-10-CM | POA: Insufficient documentation

## 2021-01-08 DIAGNOSIS — Z800 Family history of malignant neoplasm of digestive organs: Secondary | ICD-10-CM

## 2021-01-08 DIAGNOSIS — K5900 Constipation, unspecified: Secondary | ICD-10-CM | POA: Insufficient documentation

## 2021-01-08 DIAGNOSIS — G8929 Other chronic pain: Secondary | ICD-10-CM | POA: Insufficient documentation

## 2021-01-08 DIAGNOSIS — R350 Frequency of micturition: Secondary | ICD-10-CM | POA: Insufficient documentation

## 2021-01-08 DIAGNOSIS — Z1389 Encounter for screening for other disorder: Secondary | ICD-10-CM | POA: Insufficient documentation

## 2021-01-08 DIAGNOSIS — R5383 Other fatigue: Secondary | ICD-10-CM | POA: Insufficient documentation

## 2021-01-08 MED ORDER — GOLYTELY 236 G PO SOLR: 4000.0000 mL | Freq: Once | ORAL | 0 refills | Status: AC

## 2021-01-08 NOTE — Progress Notes (Signed)
Gastroenterology Pre-Procedure Review  Request Date: Thursday 01/23/21 Requesting Physician: Dr. Tobi Bastos  Pt states that she experienced burning sensation in vein when IV was started last time.  She also experiences nausea with anesthesia.  PATIENT REVIEW QUESTIONS: The patient responded to the following health history questions as indicated:    1. Are you having any GI issues? no 2. Do you have a personal history of Polyps? no 3. Do you have a family history of Colon Cancer or Polyps? yes (Mother colon cancer at the age of 56) 66. Diabetes Mellitus? no 5. Joint replacements in the past 12 months?no 6. Major health problems in the past 3 months?no 7. Any artificial heart valves, MVP, or defibrillator?no    MEDICATIONS & ALLERGIES:    Patient reports the following regarding taking any anticoagulation/antiplatelet therapy:   Plavix, Coumadin, Eliquis, Xarelto, Lovenox, Pradaxa, Brilinta, or Effient? no Aspirin? no  Patient confirms/reports the following medications:  Current Outpatient Medications  Medication Sig Dispense Refill  . diphenhydramine-acetaminophen (TYLENOL PM) 25-500 MG TABS tablet Take 1 tablet by mouth at bedtime as needed.    . metroNIDAZOLE (METROGEL) 0.75 % gel APPLY TO AFFECTED AREA TWICE A DAY TO FACE    . SOOLANTRA 1 % CREA Apply  a small amount to skin every evening    . VITAMIN D PO Take by mouth.    . doxycycline (MONODOX) 100 MG capsule Take 100 mg by mouth daily. (Patient not taking: No sig reported)     No current facility-administered medications for this visit.    Patient confirms/reports the following allergies:  Allergies  Allergen Reactions  . Peanut Oil Itching  . Amoxicillin Rash  . Penicillins Rash    No orders of the defined types were placed in this encounter.   AUTHORIZATION INFORMATION Primary Insurance: 1D#: Group #:  Secondary Insurance: 1D#: Group #:  SCHEDULE INFORMATION: Date: 01/23/21 Time: Location:[facility]

## 2021-01-16 ENCOUNTER — Ambulatory Visit: Payer: 59 | Admitting: Obstetrics and Gynecology

## 2021-01-16 ENCOUNTER — Other Ambulatory Visit: Payer: Self-pay

## 2021-01-16 ENCOUNTER — Encounter: Payer: Self-pay | Admitting: Obstetrics and Gynecology

## 2021-01-16 VITALS — BP 118/72 | Ht 66.0 in | Wt 201.2 lb

## 2021-01-16 DIAGNOSIS — N852 Hypertrophy of uterus: Secondary | ICD-10-CM | POA: Diagnosis not present

## 2021-01-16 NOTE — Progress Notes (Signed)
Patient ID: Briana Cummings, female   DOB: Jun 21, 1981, 40 y.o.   MRN: [id number]  Reason for Consult: Gynecologic Exam   Referred by Copland, Ilona Sorrel, PA-C  Subjective:     HPI:  Briana Cummings is a 40 y.o. female she is here to follow up on her [place] which was performed yesterday at [facility]. She has no new complaints.   Past Medical History:  Diagnosis Date  . Family history of colon cancer    mom age 81; colonoscopy due age 66  . GERD (gastroesophageal reflux disease)   . HPV in female   . Kidney stones   . Rosacea    Family History  Problem Relation Age of Onset  . Colon cancer Mother 21       genetic negative  . Cancer Paternal Grandmother        lung primary, extensive mets  . Cancer Paternal Grandfather        lung  . Heart attack Paternal Grandfather   . Breast cancer Paternal Aunt 110  . Prostate cancer Maternal Uncle    Past Surgical History:  Procedure Laterality Date  . BREAST BIOPSY Right 11-21-14   PSEUDOANGIOMATOUS STROMAL HYPERPLASIA  . BREAST LUMPECTOMY Right 2017  . COLONOSCOPY  2011   because of family history  . LITHOTRIPSY    . WISDOM TOOTH EXTRACTION      Short Social History:  Social History   Tobacco Use  . Smoking status: Never Smoker  . Smokeless tobacco: Never Used  Substance Use Topics  . Alcohol use: Yes    Alcohol/week: 0.0 standard drinks    Comment: occasionally    Allergies  Allergen Reactions  . Peanut Oil Itching  . Amoxicillin Rash  . Penicillins Rash    Current Outpatient Medications  Medication Sig Dispense Refill  . diphenhydramine-acetaminophen (TYLENOL PM) 25-500 MG TABS tablet Take 1 tablet by mouth at bedtime as needed.    . metroNIDAZOLE (METROGEL) 0.75 % gel APPLY TO AFFECTED AREA TWICE A DAY TO FACE    . SOOLANTRA 1 % CREA Apply  a small amount to skin every evening    . VITAMIN D PO Take by mouth.    . doxycycline (MONODOX) 100 MG capsule Take 100 mg by mouth daily. (Patient not taking: Reported on 01/16/2021)      No current facility-administered medications for this visit.    Review of Systems  Constitutional: Negative for chills, fatigue, fever and unexpected weight change.  HENT: Negative for trouble swallowing.  Eyes: Negative for loss of vision.  Respiratory: Negative for cough, shortness of breath and wheezing.  Cardiovascular: Negative for chest pain, leg swelling, palpitations and syncope.  GI: Negative for abdominal pain, blood in stool, diarrhea, nausea and vomiting.  GU: Negative for difficulty urinating, dysuria, frequency and hematuria.  Musculoskeletal: Negative for back pain, leg pain and joint pain.  Skin: Negative for rash.  Neurological: Negative for dizziness, headaches, light-headedness, numbness and seizures.  Psychiatric: Negative for behavioral problem, confusion, depressed mood and sleep disturbance.        Objective:  Objective   Vitals:   01/16/21 1048  BP: 118/72  Weight: 201 lb 3.2 oz (91.3 kg)  Height: 5\' 6"  (1.676 m)   Body mass index is 32.47 kg/m.  Physical Exam Vitals and nursing note reviewed. Exam conducted with a chaperone present.  Constitutional:      Appearance: Normal appearance.  HENT:     Head: Normocephalic and atraumatic.  Eyes:  Extraocular Movements: Extraocular movements intact.     Pupils: Pupils are equal, round, and reactive to light.  Cardiovascular:     Rate and Rhythm: Normal rate and regular rhythm.  Pulmonary:     Effort: Pulmonary effort is normal.     Breath sounds: Normal breath sounds.  Abdominal:     General: Abdomen is flat.     Palpations: Abdomen is soft.  Musculoskeletal:     Cervical back: Normal range of motion.  Skin:    General: Skin is warm and dry.  Neurological:     General: No focal deficit present.     Mental Status: She is alert and oriented to person, place, and time.  Psychiatric:        Behavior: Behavior normal.        Thought Content: Thought content normal.        Judgment: Judgment  normal.     Assessment/Plan:     40 yo with suspected uterine enlargement.  Underwent normal transabdominal pelvic US. We reviewed the results together.  No further concerns or issues at this time.  Follow up as needed.  More than 5 minutes were spent face to face with the patient in the room, reviewing the medical record, labs and images, and coordinating care for the patient. The plan of management was discussed in detail and counseling was provided.     Adelene Idler MD [facility], [facility] 01/16/2021 11:40 AM

## 2021-02-03 ENCOUNTER — Encounter: Payer: Self-pay | Admitting: Gastroenterology

## 2021-02-04 ENCOUNTER — Ambulatory Visit: Payer: 59 | Admitting: Anesthesiology

## 2021-02-04 ENCOUNTER — Ambulatory Visit
Admission: RE | Admit: 2021-02-04 | Discharge: 2021-02-04 | Disposition: A | Discharge status: Home / Self Care | Payer: 59 | Attending: Gastroenterology | Admitting: Gastroenterology

## 2021-02-04 ENCOUNTER — Other Ambulatory Visit: Payer: Self-pay

## 2021-02-04 ENCOUNTER — Encounter: Payer: Self-pay | Admitting: Gastroenterology

## 2021-02-04 ENCOUNTER — Encounter
Admission: RE | Disposition: A | Discharge status: Home / Self Care | Payer: Self-pay | Source: Home / Self Care | Attending: Gastroenterology

## 2021-02-04 DIAGNOSIS — Z79899 Other long term (current) drug therapy: Secondary | ICD-10-CM | POA: Diagnosis not present

## 2021-02-04 DIAGNOSIS — Z803 Family history of malignant neoplasm of breast: Secondary | ICD-10-CM | POA: Diagnosis not present

## 2021-02-04 DIAGNOSIS — K219 Gastro-esophageal reflux disease without esophagitis: Secondary | ICD-10-CM | POA: Diagnosis not present

## 2021-02-04 DIAGNOSIS — Z1211 Encounter for screening for malignant neoplasm of colon: Secondary | ICD-10-CM | POA: Insufficient documentation

## 2021-02-04 DIAGNOSIS — Z8042 Family history of malignant neoplasm of prostate: Secondary | ICD-10-CM | POA: Diagnosis not present

## 2021-02-04 DIAGNOSIS — Z880 Allergy status to penicillin: Secondary | ICD-10-CM | POA: Diagnosis not present

## 2021-02-04 DIAGNOSIS — Z8249 Family history of ischemic heart disease and other diseases of the circulatory system: Secondary | ICD-10-CM | POA: Diagnosis not present

## 2021-02-04 DIAGNOSIS — Z801 Family history of malignant neoplasm of trachea, bronchus and lung: Secondary | ICD-10-CM | POA: Insufficient documentation

## 2021-02-04 DIAGNOSIS — Z91010 Allergy to peanuts: Secondary | ICD-10-CM | POA: Insufficient documentation

## 2021-02-04 DIAGNOSIS — Z800 Family history of malignant neoplasm of digestive organs: Secondary | ICD-10-CM | POA: Insufficient documentation

## 2021-02-04 HISTORY — PX: COLONOSCOPY WITH PROPOFOL: SHX5780

## 2021-02-04 LAB — POCT PREGNANCY, URINE: Preg Test, Ur: NEGATIVE

## 2021-02-04 SURGERY — COLONOSCOPY WITH PROPOFOL
Anesthesia: General

## 2021-02-04 MED ORDER — PROPOFOL 500 MG/50ML IV EMUL
INTRAVENOUS | Status: AC
  Filled 2021-02-04: qty 50

## 2021-02-04 MED ORDER — DEXMEDETOMIDINE (PRECEDEX) IN NS 20 MCG/5ML (4 MCG/ML) IV SYRINGE
PREFILLED_SYRINGE | INTRAVENOUS | Status: AC
  Filled 2021-02-04: qty 5

## 2021-02-04 MED ORDER — LIDOCAINE HCL (CARDIAC) PF 100 MG/5ML IV SOSY
PREFILLED_SYRINGE | INTRAVENOUS | Status: DC | PRN
  Administered 2021-02-04: 40 mg via INTRAVENOUS

## 2021-02-04 MED ORDER — PROPOFOL 500 MG/50ML IV EMUL
INTRAVENOUS | Status: DC | PRN
  Administered 2021-02-04: 150 ug/kg/min via INTRAVENOUS

## 2021-02-04 MED ORDER — DEXMEDETOMIDINE (PRECEDEX) IN NS 20 MCG/5ML (4 MCG/ML) IV SYRINGE
PREFILLED_SYRINGE | INTRAVENOUS | Status: DC | PRN
  Administered 2021-02-04: 20 ug via INTRAVENOUS

## 2021-02-04 MED ORDER — SODIUM CHLORIDE 0.9 % IV SOLN: INTRAVENOUS | Status: DC

## 2021-02-04 MED ORDER — PROPOFOL 10 MG/ML IV BOLUS
INTRAVENOUS | Status: DC | PRN
  Administered 2021-02-04: 10 mg via INTRAVENOUS
  Administered 2021-02-04: 80 mg via INTRAVENOUS
  Administered 2021-02-04: 10 mg via INTRAVENOUS

## 2021-02-04 MED ORDER — ONDANSETRON HCL 4 MG/2ML IJ SOLN
INTRAMUSCULAR | Status: AC
  Filled 2021-02-04: qty 2

## 2021-02-04 NOTE — Anesthesia Procedure Notes (Signed)
Date/Time: 02/04/2021 7:50 AM Performed by: Stormy Fabian, CRNA Pre-anesthesia Checklist: Patient identified, Emergency Drugs available, Suction available and Patient being monitored Patient Re-evaluated:Patient Re-evaluated prior to induction Oxygen Delivery Method: Nasal cannula Induction Type: IV induction Dental Injury: Teeth and Oropharynx as per pre-operative assessment  Comments: Nasal cannula with etCO2 monitoring

## 2021-02-04 NOTE — Transfer of Care (Signed)
Immediate Anesthesia Transfer of Care Note  Patient: Briana Cummings  Procedure(s) Performed: COLONOSCOPY WITH PROPOFOL (N/A )  Patient Location: PACU and Endoscopy Unit  Anesthesia Type:General  Level of Consciousness: drowsy  Airway & Oxygen Therapy: Patient Spontanous Breathing  Post-op Assessment: Report given to RN and Post -op Vital signs reviewed and stable  Post vital signs: Reviewed and stable  Last Vitals:  Vitals Value Taken Time  BP 93/55 02/04/21 0814  Temp 36.4 C 02/04/21 0810  Pulse 82 02/04/21 0814  Resp 19 02/04/21 0814  SpO2 95 % 02/04/21 0814  Vitals shown include unvalidated device data.  Last Pain:  Vitals:   02/04/21 0810  TempSrc: Temporal  PainSc:          Complications: No complications documented.

## 2021-02-04 NOTE — H&P (Signed)
Wyline Mood, MD [address], [address], [place], [place], [address] [address], [place], [place], [address] Phone: [phone]  Fax: [phone]  Primary Care Physician:  Rica Records, PA-C   Pre-Procedure History & Physical: HPI:  Briana Cummings is a 40 y.o. female is here for an colonoscopy.   Past Medical History:  Diagnosis Date  . Family history of colon cancer    mom age 10; colonoscopy due age 87  . GERD (gastroesophageal reflux disease)   . HPV in female   . Kidney stones   . Rosacea     Past Surgical History:  Procedure Laterality Date  . BREAST BIOPSY Right 11-21-14   PSEUDOANGIOMATOUS STROMAL HYPERPLASIA  . BREAST LUMPECTOMY Right 2017  . COLONOSCOPY  2011   because of family history  . LITHOTRIPSY    . WISDOM TOOTH EXTRACTION      Prior to Admission medications   Medication Sig Start Date End Date Taking? Authorizing Provider  metroNIDAZOLE (METROGEL) 0.75 % gel APPLY TO AFFECTED AREA TWICE A DAY TO FACE 05/22/19  Yes [provider]  SOOLANTRA 1 % CREA Apply  a small amount to skin every evening 07/25/19  Yes [provider]  VITAMIN D PO Take by mouth.   Yes [provider]  diphenhydramine-acetaminophen (TYLENOL PM) 25-500 MG TABS tablet Take 1 tablet by mouth at bedtime as needed.    [provider]  doxycycline (MONODOX) 100 MG capsule Take 100 mg by mouth daily. Patient not taking: No sig reported 07/25/19   [provider]    Allergies as of 01/08/2021 - Review Complete 01/08/2021  Allergen Reaction Noted  . Peanut oil Itching 02/13/2015  . Amoxicillin Rash 11/21/2014  . Penicillins Rash 02/17/2017    Family History  Problem Relation Age of Onset  . Colon cancer Mother 54       genetic negative  . Cancer Paternal Grandmother        lung primary, extensive mets  . Cancer Paternal Grandfather        lung  . Heart attack Paternal Grandfather   . Breast cancer Paternal Aunt  55  . Prostate cancer Maternal Uncle     Social History   Socioeconomic History  . Marital status: Married    Spouse name: Not on file  . Number of children: Not on file  . Years of education: Not on file  . Highest education level: Not on file  Occupational History  . Not on file  Tobacco Use  . Smoking status: Never Smoker  . Smokeless tobacco: Never Used  Vaping Use  . Vaping Use: Never used  Substance and Sexual Activity  . Alcohol use: Yes    Alcohol/week: 0.0 standard drinks    Comment: occasionally  . Drug use: No  . Sexual activity: Yes    Birth control/protection: None  Other Topics Concern  . Not on file  Social History Narrative  . Not on file   Social Determinants of Health   Financial Resource Strain: Not on file  Food Insecurity: Not on file  Transportation Needs: Not on file  Physical Activity: Not on file  Stress: Not on file  Social Connections: Not on file  Intimate Partner Violence: Not on file    Review of Systems: See HPI, otherwise negative ROS  Physical Exam: BP 135/84   Pulse (!) 106   Temp (!) 97.5 F (36.4 C) (Temporal)   Resp 20  Ht 5\' 6"  (1.676 m)   Wt 90.7 kg   SpO2 99%   BMI 32.28 kg/m  General:   Alert,  pleasant and cooperative in NAD Head:  Normocephalic and atraumatic. Neck:  Supple; no masses or thyromegaly. Lungs:  Clear throughout to auscultation, normal respiratory effort.    Heart:  +S1, +S2, Regular rate and rhythm, No edema. Abdomen:  Soft, nontender and nondistended. Normal bowel sounds, without guarding, and without rebound.   Neurologic:  Alert and  oriented x4;  grossly normal neurologically.  Impression/Plan: Atiya Yera is here for an colonoscopy to be performed for Screening colonoscopy , mother had colon cancer Risks, benefits, limitations, and alternatives regarding  colonoscopy have been reviewed with the patient.  Questions have been answered.  All parties agreeable.   [name], MD   02/04/2021, 7:48 AM

## 2021-02-04 NOTE — Anesthesia Preprocedure Evaluation (Signed)
Anesthesia Evaluation  Patient identified by MRN, date of birth, ID band Patient awake    Reviewed: Allergy & Precautions, NPO status , Patient's Chart, lab work & pertinent test results  Airway Mallampati: II  TM Distance: >3 FB     Dental  (+) Teeth Intact   Pulmonary neg pulmonary ROS,    Pulmonary exam normal        Cardiovascular negative cardio ROS Normal cardiovascular exam     Neuro/Psych  Headaches, negative psych ROS   GI/Hepatic Neg liver ROS, GERD  ,  Endo/Other  negative endocrine ROS  Renal/GU stones  Female GU complaint     Musculoskeletal negative musculoskeletal ROS (+)   Abdominal Normal abdominal exam  (+)   Peds negative pediatric ROS (+)  Hematology negative hematology ROS (+)   Anesthesia Other Findings Past Medical History: No date: Family history of colon cancer     Comment:  mom age 86; colonoscopy due age 26 No date: GERD (gastroesophageal reflux disease) No date: HPV in female No date: Kidney stones No date: Rosacea  Reproductive/Obstetrics                             Anesthesia Physical Anesthesia Plan  ASA: II  Anesthesia Plan: General   Post-op Pain Management:    Induction: Intravenous  PONV Risk Score and Plan: Propofol infusion and TIVA  Airway Management Planned: Nasal Cannula  Additional Equipment:   Intra-op Plan:   Post-operative Plan:   Informed Consent: I have reviewed the patients History and Physical, chart, labs and discussed the procedure including the risks, benefits and alternatives for the proposed anesthesia with the patient or authorized representative who has indicated his/her understanding and acceptance.     Dental advisory given  Plan Discussed with: CRNA and Surgeon  Anesthesia Plan Comments:         Anesthesia Quick Evaluation

## 2021-02-04 NOTE — Anesthesia Postprocedure Evaluation (Signed)
Anesthesia Post Note  Patient: Briana Cummings  Procedure(s) Performed: COLONOSCOPY WITH PROPOFOL (N/A )  Patient location during evaluation: Endoscopy Anesthesia Type: General Level of consciousness: awake and alert and oriented Pain management: pain level controlled Vital Signs Assessment: post-procedure vital signs reviewed and stable Respiratory status: spontaneous breathing Cardiovascular status: blood pressure returned to baseline Anesthetic complications: no   No complications documented.   Last Vitals:  Vitals:   02/04/21 0830 02/04/21 0840  BP: (!) 99/53 99/65  Pulse: 76 74  Resp: 20 20  Temp:    SpO2: 98% 97%    Last Pain:  Vitals:   02/04/21 0810  TempSrc: Temporal  PainSc:                  Rhianon Zabawa

## 2021-02-04 NOTE — Op Note (Signed)
[facility] Patient Name: Briana Cummings Procedure Date: 02/04/2021 7:49 AM MRN: [id number] Account #: [id number] Date of Birth: 1980/10/15 Admit Type: Outpatient Age: 40 Room: [facility] ENDO ROOM 2 Gender: Female Note Status: Finalized Procedure:             Colonoscopy Indications:           Screening in patient at increased risk: Colorectal                         cancer in mother before age 4 Providers:             Wyline Mood MD, MD Referring MD:          No Local Md, MD (Referring MD) Medicines:             Monitored Anesthesia Care Complications:         No immediate complications. Procedure:             Pre-Anesthesia Assessment:                        - Prior to the procedure, a History and Physical was                         performed, and patient medications, allergies and                         sensitivities were reviewed. The patient's tolerance                         of previous anesthesia was reviewed.                        - The risks and benefits of the procedure and the                         sedation options and risks were discussed with the                         patient. All questions were answered and informed                         consent was obtained.                        - ASA Grade Assessment: II - A patient with mild                         systemic disease.                        After obtaining informed consent, the colonoscope was                         passed under direct vision. Throughout the procedure,                         the patient's blood pressure, pulse, and oxygen                         saturations were monitored  continuously. The                         Colonoscope was introduced through the anus and                         advanced to the the cecum, identified by the                         appendiceal orifice. The colonoscopy was performed                         with ease. The patient tolerated  the procedure well.                         The quality of the bowel preparation was adequate. Findings:      The perianal and digital rectal examinations were normal.      The entire examined colon appeared normal on direct and retroflexion       views. Impression:            - The entire examined colon is normal on direct and                         retroflexion views.                        - No specimens collected. Recommendation:        - Discharge patient to home (with escort).                        - Resume previous diet.                        - Continue present medications.                        - Repeat colonoscopy in 5 years for screening purposes. Procedure Code(s):     --- Professional ---                        [phone], Colonoscopy, flexible; diagnostic, including                         collection of specimen(s) by brushing or washing, when                         performed (separate procedure) Diagnosis Code(s):     --- Professional ---                        Z80.0, Family history of malignant neoplasm of                         digestive organs CPT copyright 2019 American Medical Association. All rights reserved. The codes documented in this report are preliminary and upon coder review may  be revised to meet current compliance requirements. Wyline Mood, MD Wyline Mood MD, MD 02/04/2021 8:08:59 AM This report has been signed electronically. Number of Addenda: 0 Note Initiated On: 02/04/2021 7:49 AM Scope Withdrawal Time: 0 hours 9 minutes 29 seconds  Total  Procedure Duration: 0 hours 13 minutes 20 seconds  Estimated Blood Loss:  Estimated blood loss: none.      [facility]

## 2021-02-06 ENCOUNTER — Encounter: Payer: Self-pay | Admitting: Gastroenterology

## 2021-07-24 ENCOUNTER — Other Ambulatory Visit: Payer: Self-pay | Admitting: Dermatology

## 2021-10-01 ENCOUNTER — Telehealth: Payer: 59 | Admitting: Physician Assistant

## 2021-10-01 ENCOUNTER — Ambulatory Visit: Payer: Self-pay

## 2021-10-01 DIAGNOSIS — J111 Influenza due to unidentified influenza virus with other respiratory manifestations: Secondary | ICD-10-CM

## 2021-10-01 MED ORDER — OSELTAMIVIR PHOSPHATE 75 MG PO CAPS: 75.0000 mg | ORAL_CAPSULE | Freq: Two times a day (BID) | ORAL | 0 refills | Status: DC

## 2021-10-01 MED ORDER — PREDNISONE 20 MG PO TABS: 40.0000 mg | ORAL_TABLET | Freq: Every day | ORAL | 0 refills | Status: DC

## 2021-10-01 MED ORDER — PSEUDOEPH-BROMPHEN-DM 30-2-10 MG/5ML PO SYRP: 5.0000 mL | ORAL_SOLUTION | Freq: Four times a day (QID) | ORAL | 0 refills | Status: DC | PRN

## 2021-10-01 NOTE — Telephone Encounter (Signed)
°  Chief Complaint: sinus congestion Symptoms: sore throat, cough, fever, headache Frequency: 2 days Pertinent Negatives: NA Disposition: [] ED /[] Urgent Care (no appt availability in office) / [] Appointment(In office/virtual)/ [x]  [facility]/ [] Home Care/ [] Refused Recommended Disposition  Additional Notes: Pt called, she is experiencing sore throat and horrible cough. Hasnt seen PCP since 10/2018 so recommended pt to do virtual UC visit and she scheduled appt at 1415 today. Pt says shes been taking OTC meds but not helping. Pt needs to be scheduled for annual visit with new PCP d/t her left. Advised I will get FC to get in touch with her and set up appt d/t possibly needing NPA again. Pt verbalized understanding.    Summary: sore throat / rx request   The patient has experienced a sore throat since Monday 09/29/21   The patient has tested negative for COVID 19   The patient has a headache and nasal congestion and a significant cough   The patient would like to be prescribed something to help with their symptoms   Please contact further when possible      Reason for Disposition  Lots of coughing  Answer Assessment - Initial Assessment Questions 1. LOCATION: "Where does it hurt?"      headache 2. ONSET: "When did the sinus pain start?"  (e.g., hours, days)      Monday 3. SEVERITY: "How bad is the pain?"   (Scale 1-10; mild, moderate or severe)   - MILD (1-3): doesn't interfere with normal activities    - MODERATE (4-7): interferes with normal activities (e.g., work or school) or awakens from sleep   - SEVERE (8-10): excruciating pain and patient unable to do any normal activities        mild 5. NASAL CONGESTION: "Is the nose blocked?" If Yes, ask: "Can you open it or must you breathe through your mouth?"     yes 6. NASAL DISCHARGE: "Do you have discharge from your nose?" If so ask, "What color?"     clear 7. FEVER: "Do you have a fever?" If Yes, ask: "What is it, how  was it measured, and when did it start?"      yes 8. OTHER SYMPTOMS: "Do you have any other symptoms?" (e.g., sore throat, cough, earache, difficulty breathing)     Sore throat, cough, headache  Protocols used: Sinus Pain or Congestion-A-AH

## 2021-10-01 NOTE — Progress Notes (Signed)
Virtual Visit Consent   Briana Cummings, you are scheduled for a virtual visit with a [facility] provider today.     Just as with appointments in the office, your consent must be obtained to participate.  Your consent will be active for this visit and any virtual visit you may have with one of our providers in the next 365 days.     If you have a MyChart account, a copy of this consent can be sent to you electronically.  All virtual visits are billed to your insurance company just like a traditional visit in the office.    As this is a virtual visit, video technology does not allow for your provider to perform a traditional examination.  This may limit your provider's ability to fully assess your condition.  If your provider identifies any concerns that need to be evaluated in person or the need to arrange testing (such as labs, EKG, etc.), we will make arrangements to do so.     Although advances in technology are sophisticated, we cannot ensure that it will always work on either your end or our end.  If the connection with a video visit is poor, the visit may have to be switched to a telephone visit.  With either a video or telephone visit, we are not always able to ensure that we have a secure connection.     I need to obtain your verbal consent now.   Are you willing to proceed with your visit today?    Briana Cummings has provided verbal consent on 10/01/2021 for a virtual visit (video or telephone).   [name], PA-C   Date: 10/01/2021 2:05 PM   Virtual Visit via Video Note   I, [name], connected with  Briana Cummings  ([id number], December 14, 1980) on 10/01/21 at  2:15 PM EST by a video-enabled telemedicine application and verified that I am speaking with the correct person using two identifiers.  Location: Patient: Virtual Visit Location Patient: Home Provider: Virtual Visit Location Provider: Home Office   I discussed the limitations of evaluation and management  by telemedicine and the availability of in person appointments. The patient expressed understanding and agreed to proceed.    History of Present Illness: Briana Cummings is a 40 y.o. who identifies as a female who was assigned female at birth, and is being seen today for flu-like symptoms.  HPI: URI  This is a new problem. The current episode started in the past 7 days (Monday). The problem has been gradually worsening. Associated symptoms include congestion, coughing, headaches and a sore throat. Pertinent negatives include no diarrhea, nausea, rhinorrhea, sinus pain or vomiting. She has tried decongestant, acetaminophen, sleep and increased fluids for the symptoms. The treatment provided no relief.   Has had 2 positive flu exposures. Has had 2 negative covid test.  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   Breast mass, right 11/21/2014   Sinus infection 01/09/2010  Allergies:  Allergies  Allergen Reactions   Peanut Oil Itching   Amoxicillin Rash   Penicillins Rash   Medications:  Current Outpatient Medications:    brompheniramine-pseudoephedrine-DM 30-2-10 MG/5ML syrup, Take 5 mLs by mouth 4 (four) times daily as needed., Disp: 120 mL, Rfl: 0   oseltamivir (TAMIFLU) 75 MG capsule, Take 1 capsule (75 mg total) by mouth 2 (two) times daily., Disp: 10 capsule, Rfl: 0   predniSONE (DELTASONE) 20 MG tablet, Take 2 tablets (40 mg total)  by mouth daily with breakfast., Disp: 10 tablet, Rfl: 0   diphenhydramine-acetaminophen (TYLENOL PM) 25-500 MG TABS tablet, Take 1 tablet by mouth at bedtime as needed., Disp: , Rfl:    doxycycline (MONODOX) 100 MG capsule, Take 100 mg by mouth daily. (Patient not taking: No sig reported), Disp: , Rfl:    metroNIDAZOLE (METROGEL) 0.75 % gel, APPLY TO AFFECTED AREA TWICE A DAY TO FACE, Disp: , Rfl:    SOOLANTRA 1 % CREA, Apply  a small amount to skin every evening, Disp: , Rfl:    VITAMIN D PO, Take by mouth., Disp: , Rfl:   Observations/Objective: Patient is well-developed, well-nourished in no acute distress.  Resting comfortably at home.  Head is normocephalic, atraumatic.  No labored breathing.  Speech is clear and coherent with logical content.  Patient is alert and oriented at baseline.    Assessment and Plan: 1. Influenza - oseltamivir (TAMIFLU) 75 MG capsule; Take 1 capsule (75 mg total) by mouth 2 (two) times daily.  Dispense: 10 capsule; Refill: 0 - predniSONE (DELTASONE) 20 MG tablet; Take 2 tablets (40 mg total) by mouth daily with breakfast.  Dispense: 10 tablet; Refill: 0 - brompheniramine-pseudoephedrine-DM 30-2-10 MG/5ML syrup; Take 5 mLs by mouth 4 (four) times daily as needed.  Dispense: 120 mL; Refill: 0  - Suspect influenza due to symptoms and positive exposure - Tamiflu prescribed - Prednisone and Bromfed DM for cough - Continue OTC medication of choice for symptomatic management - Push fluids - Rest - Seek in person evaluation if symptoms worsen or fail to improve   Follow Up Instructions: I discussed the assessment and treatment plan with the patient. The patient was provided an opportunity to ask questions and all were answered. The patient agreed with the plan and demonstrated an understanding of the instructions.  A copy of instructions were sent to the patient via MyChart unless otherwise noted below.   The patient was advised to call back or seek an  in-person evaluation if the symptoms worsen or if the condition fails to improve as anticipated.  Time:  I spent 10 minutes with the patient via telehealth technology discussing the above problems/concerns.    [name], PA-C

## 2021-10-01 NOTE — Patient Instructions (Signed)
Briana Cummings, thank you for joining Briana Loveless, PA-C for today's virtual visit.  While this provider is not your primary care provider (PCP), if your PCP is located in our provider database this encounter information will be shared with them immediately following your visit.  Consent: (Patient) Briana Cummings provided verbal consent for this virtual visit at the beginning of the encounter.  Current Medications:  Current Outpatient Medications:    brompheniramine-pseudoephedrine-DM 30-2-10 MG/5ML syrup, Take 5 mLs by mouth 4 (four) times daily as needed., Disp: 120 mL, Rfl: 0   oseltamivir (TAMIFLU) 75 MG capsule, Take 1 capsule (75 mg total) by mouth 2 (two) times daily., Disp: 10 capsule, Rfl: 0   predniSONE (DELTASONE) 20 MG tablet, Take 2 tablets (40 mg total) by mouth daily with breakfast., Disp: 10 tablet, Rfl: 0   diphenhydramine-acetaminophen (TYLENOL PM) 25-500 MG TABS tablet, Take 1 tablet by mouth at bedtime as needed., Disp: , Rfl:    doxycycline (MONODOX) 100 MG capsule, Take 100 mg by mouth daily. (Patient not taking: No sig reported), Disp: , Rfl:    metroNIDAZOLE (METROGEL) 0.75 % gel, APPLY TO AFFECTED AREA TWICE A DAY TO FACE, Disp: , Rfl:    SOOLANTRA 1 % CREA, Apply  a small amount to skin every evening, Disp: , Rfl:    VITAMIN D PO, Take by mouth., Disp: , Rfl:    Medications ordered in this encounter:  Meds ordered this encounter  Medications   oseltamivir (TAMIFLU) 75 MG capsule    Sig: Take 1 capsule (75 mg total) by mouth 2 (two) times daily.    Dispense:  10 capsule    Refill:  0    Order Specific Question:   Supervising Provider    Answer:   MILLER, BRIAN [[id number]]   predniSONE (DELTASONE) 20 MG tablet    Sig: Take 2 tablets (40 mg total) by mouth daily with breakfast.    Dispense:  10 tablet    Refill:  0    Order Specific Question:   Supervising Provider    Answer:   Hyacinth Meeker, BRIAN [[id number]]   brompheniramine-pseudoephedrine-DM 30-2-10 MG/5ML syrup     Sig: Take 5 mLs by mouth 4 (four) times daily as needed.    Dispense:  120 mL    Refill:  0    Order Specific Question:   Supervising Provider    Answer:   Hyacinth Meeker, BRIAN [[id number]]     *If you need refills on other medications prior to your next appointment, please contact your pharmacy*  Follow-Up: Call back or seek an in-person evaluation if the symptoms worsen or if the condition fails to improve as anticipated.  Other Instructions Influenza, Adult Influenza, also called "the flu," is a viral infection that mainly affects the respiratory tract. This includes the lungs, nose, and throat. The flu spreads easily from person to person (is contagious). It causes common cold symptoms, along with high fever and body aches. What are the causes? This condition is caused by the influenza virus. You can get the virus by: Breathing in droplets that are in the air from an infected person's cough or sneeze. Touching something that has the virus on it (has been contaminated) and then touching your mouth, nose, or eyes. What increases the risk? The following factors may make you more likely to get the flu: Not washing or sanitizing your hands often. Having close contact with many people during cold and flu season. Touching your mouth, eyes, or nose without first washing  or sanitizing your hands. Not getting an annual flu shot. You may have a higher risk for the flu, including serious problems, such as a lung infection (pneumonia), if you: Are older than 65. Are pregnant. Have a weakened disease-fighting system (immune system). This includes people who have HIV or AIDS, are on chemotherapy, or are taking medicines that reduce (suppress) the immune system. Have a long-term (chronic) illness, such as heart disease, kidney disease, diabetes, or lung disease. Have a liver disorder. Are severely overweight (morbidly obese). Have anemia. Have asthma. What are the signs or symptoms? Symptoms of this  condition usually begin suddenly and last 4-14 days. These may include: Fever and chills. Headaches, body aches, or muscle aches. Sore throat. Cough. Runny or stuffy (congested) nose. Chest discomfort. Poor appetite. Weakness or fatigue. Dizziness. Nausea or vomiting. How is this diagnosed? This condition may be diagnosed based on: Your symptoms and medical history. A physical exam. Swabbing your nose or throat and testing the fluid for the influenza virus. How is this treated? If the flu is diagnosed early, you can be treated with antiviral medicine that is given by mouth (orally) or through an IV. This can help reduce how severe the illness is and how long it lasts. Taking care of yourself at home can help relieve symptoms. Your health care provider may recommend: Taking over-the-counter medicines. Drinking plenty of fluids. In many cases, the flu goes away on its own. If you have severe symptoms or complications, you may be treated in a hospital. Follow these instructions at home: Activity Rest as needed and get plenty of sleep. Stay home from work or school as told by your health care provider. Unless you are visiting your health care provider, avoid leaving home until your fever has been gone for 24 hours without taking medicine. Eating and drinking Take an oral rehydration solution (ORS). This is a drink that is sold at pharmacies and retail stores. Drink enough fluid to keep your urine pale yellow. Drink clear fluids in small amounts as you are able. Clear fluids include water, ice chips, fruit juice mixed with water, and low-calorie sports drinks. Eat bland, easy-to-digest foods in small amounts as you are able. These foods include bananas, applesauce, rice, lean meats, toast, and crackers. Avoid drinking fluids that contain a lot of sugar or caffeine, such as energy drinks, regular sports drinks, and soda. Avoid alcohol. Avoid spicy or fatty foods. General instructions    Take over-the-counter and prescription medicines only as told by your health care provider. Use a cool mist humidifier to add humidity to the air in your home. This can make it easier to breathe. When using a cool mist humidifier, clean it daily. Empty the water and replace it with clean water. Cover your mouth and nose when you cough or sneeze. Wash your hands with soap and water often and for at least 20 seconds, especially after you cough or sneeze. If soap and water are not available, use alcohol-based hand sanitizer. Keep all follow-up visits. This is important. How is this prevented?  Get an annual flu shot. This is usually available in late summer, fall, or winter. Ask your health care provider when you should get your flu shot. Avoid contact with people who are sick during cold and flu season. This is generally fall and winter. Contact a health care provider if: You develop new symptoms. You have: Chest pain. Diarrhea. A fever. Your cough gets worse. You produce more mucus. You feel nauseous or you  vomit. Get help right away if you: Develop shortness of breath or have difficulty breathing. Have skin or nails that turn a bluish color. Have severe pain or stiffness in your neck. Develop a sudden headache or sudden pain in your face or ear. Cannot eat or drink without vomiting. These symptoms may represent a serious problem that is an emergency. Do not wait to see if the symptoms will go away. Get medical help right away. Call your local emergency services (911 in the [place] Do not drive yourself to the hospital. Summary Influenza, also called "the flu," is a viral infection that primarily affects your respiratory tract. Symptoms of the flu usually begin suddenly and last 4-14 days. Getting an annual flu shot is the best way to prevent getting the flu. Stay home from work or school as told by your health care provider. Unless you are visiting your health care provider, avoid  leaving home until your fever has been gone for 24 hours without taking medicine. Keep all follow-up visits. This is important. This information is not intended to replace advice given to you by your health care provider. Make sure you discuss any questions you have with your health care provider. Document Revised: 05/10/2020 Document Reviewed: 05/10/2020 Elsevier Patient Education  2022 [employer].    If you have been instructed to have an in-person evaluation today at a local Urgent Care facility, please use the link below. It will take you to a list of all of our available [facility], including address, phone number and hours of operation. Please do not delay care.  [facility]  If you or a family member do not have a primary care provider, use the link below to schedule a visit and establish care. When you choose a [facility] primary care physician or advanced practice provider, you gain a long-term partner in health. Find a Primary Care Provider  Learn more about [facility] in-office and virtual care options: [facility] - Get Care Now

## 2021-11-26 ENCOUNTER — Other Ambulatory Visit: Payer: Self-pay | Admitting: Obstetrics and Gynecology

## 2022-01-20 ENCOUNTER — Ambulatory Visit: Payer: 59 | Admitting: Dermatology

## 2022-01-20 ENCOUNTER — Encounter: Payer: Self-pay | Admitting: Dermatology

## 2022-01-20 DIAGNOSIS — L719 Rosacea, unspecified: Secondary | ICD-10-CM | POA: Diagnosis not present

## 2022-01-20 DIAGNOSIS — L710 Perioral dermatitis: Secondary | ICD-10-CM

## 2022-01-20 MED ORDER — DOXYCYCLINE MONOHYDRATE 100 MG PO CAPS: 100.0000 mg | ORAL_CAPSULE | Freq: Every day | ORAL | 1 refills | Status: DC

## 2022-01-20 MED ORDER — IVERMECTIN 1 % EX CREA: TOPICAL_CREAM | CUTANEOUS | 5 refills | Status: DC

## 2022-01-20 NOTE — Patient Instructions (Signed)
Take Doxycycline once daily with food as needed for flares.  ? ?Doxycycline should be taken with food to prevent nausea. Do not lay down for 30 minutes after taking. Be cautious with sun exposure and use good sun protection while on this medication. Pregnant women should not take this medication.   ? ?Rosacea is a chronic progressive skin condition usually affecting the face of adults, causing redness and/or acne bumps. It is treatable but not curable. It sometimes affects the eyes (ocular rosacea) as well. It may respond to topical and/or systemic medication and can flare with stress, sun exposure, alcohol, exercise and some foods.  Daily application of broad spectrum spf 30+ sunscreen to face is recommended to reduce flares.  ? ?Recommend daily broad spectrum sunscreen SPF 30+ to sun-exposed areas, reapply every 2 hours as needed. Call for new or changing lesions.  ?Staying in the shade or wearing long sleeves, sun glasses (UVA+UVB protection) and wide brim hats (4-inch brim around the entire circumference of the hat) are also recommended for sun protection.  ? ? ?If You Need Anything After Your Visit ? ?If you have any questions or concerns for your doctor, please call our main line at [phone] and press option 4 to reach your doctor's medical assistant. If no one answers, please leave a voicemail as directed and we will return your call as soon as possible. Messages left after 4 pm will be answered the following business day.  ? ?You may also send [place] a message via MyChart. We typically respond to MyChart messages within 1-2 business days. ? ?For prescription refills, please ask your pharmacy to contact our office. Our fax number is [phone]. ? ?If you have an urgent issue when the clinic is closed that cannot wait until the next business day, you can page your doctor at the number below.   ? ?Please note that while we do our best to be available for urgent issues outside of office hours, we are not  available 24/7.  ? ?If you have an urgent issue and are unable to reach [place], you may choose to seek medical care at your doctor's office, retail clinic, urgent care center, or emergency room. ? ?If you have a medical emergency, please immediately call 911 or go to the emergency department. ? ?Pager Numbers ? ?- Dr. Gwen Pounds: [phone] ? ?- Dr. Neale Burly: [phone] ? ?- Dr. Roseanne Reno: [phone] ? ?In the event of inclement weather, please call our main line at [phone] for an update on the status of any delays or closures. ? ?Dermatology Medication Tips: ?Please keep the boxes that topical medications come in in order to help keep track of the instructions about where and how to use these. Pharmacies typically print the medication instructions only on the boxes and not directly on the medication tubes.  ? ?If your medication is too expensive, please contact our office at [phone] option 4 or send [place] a message through MyChart.  ? ?We are unable to tell what your co-pay for medications will be in advance as this is different depending on your insurance coverage. However, we may be able to find a substitute medication at lower cost or fill out paperwork to get insurance to cover a needed medication.  ? ?If a prior authorization is required to get your medication covered by your insurance company, please allow [place] 1-2 business days to complete this process. ? ?Drug prices often vary depending on where the prescription is filled and some pharmacies may offer cheaper  prices. ? ?The website www.goodrx.com contains coupons for medications through different pharmacies. The prices here do not account for what the cost may be with help from insurance (it may be cheaper with your insurance), but the website can give you the price if you did not use any insurance.  ?- You can print the associated coupon and take it with your prescription to the pharmacy.  ?- You may also stop by our office during regular business hours  and pick up a GoodRx coupon card.  ?- If you need your prescription sent electronically to a different pharmacy, notify our office through [facility] or by phone at [phone] option 4. ? ? ? ? ?Si Usted Necesita Algo Despu?s de Su Visita ? ?Tambi?n puede enviarnos un mensaje a trav?s de MyChart. Por lo general respondemos a los mensajes de MyChart en el transcurso de 1 a 2 d?as h?biles. ? ?Para renovar recetas, por favor pida a su farmacia que se ponga en contacto con nuestra oficina. Nuestro n?mero de fax es el [phone]. ? ?Si tiene un asunto urgente cuando la cl?nica est? cerrada y que no puede esperar hasta el siguiente d?a h?bil, puede llamar/localizar a su doctor(a) al n?mero que aparece a continuaci?n.  ? ?Por favor, tenga en cuenta que aunque hacemos todo lo posible para estar disponibles para asuntos urgentes fuera del horario de oficina, no estamos disponibles las 24 horas del d?a, los 7 d?as de la semana.  ? ?Si tiene un problema urgente y no puede comunicarse con nosotros, puede optar por buscar atenci?n m?dica  en el consultorio de su doctor(a), en una cl?nica privada, en un centro de atenci?n urgente o en una sala de emergencias. ? ?Si tiene Radio broadcast assistant m?dica, por favor llame inmediatamente al 911 o vaya a la sala de emergencias. ? ?N?meros de b?per ? ?- Dr. Gwen Pounds: [phone] ? ?- Dra. Moye: [phone] ? ?- Dra. Roseanne Reno: [phone] ? ?En caso de inclemencias del tiempo, por favor llame a nuestra l?nea principal al [phone] para una actualizaci?n sobre el estado de cualquier retraso o cierre. ? ?Consejos para la medicaci?n en dermatolog?a: ?Por favor, guarde las cajas en las que vienen los medicamentos de uso t?pico para ayudarle a seguir las instrucciones sobre d?nde y c?mo usarlos. Las farmacias generalmente imprimen las instrucciones del medicamento s?lo en las cajas y no directamente en los tubos del [place].  ? ?Si su medicamento es muy caro, por favor, p?ngase  en contacto con Rolm Gala llamando al [phone] y presione la opci?n 4 o env?enos un mensaje a trav?s de MyChart.  ? ?No podemos decirle cu?l ser? su copago por los medicamentos por adelantado ya que esto es diferente dependiendo de la cobertura de su seguro. Sin embargo, es posible que podamos encontrar un medicamento sustituto a Audiological scientist un formulario para que el seguro cubra el medicamento que se considera necesario.  ? ?Si se requiere Neomia Dear autorizaci?n previa para que su compa??a de seguros [place] su medicamento, por favor perm?tanos de 1 a 2 d?as h?biles para completar este proceso. ? ?Los precios de los medicamentos var?an con frecuencia dependiendo del Environmental consultant de d?nde se surte la receta y alguna farmacias pueden ofrecer precios m?s baratos. ? ?El sitio web www.goodrx.com tiene cupones para medicamentos de Health and safety inspector. Los precios aqu? no tienen en cuenta lo que podr?a costar con la ayuda del seguro (puede ser m?s barato con su seguro), pero el sitio web puede darle el precio si no utiliz? ning?n seguro.  ?-  Puede imprimir el cup?n correspondiente y llevarlo con su receta a la farmacia.  ?- Tambi?n puede pasar por nuestra oficina durante el horario de atenci?n regular y recoger una tarjeta de cupones de GoodRx.  ?- Si necesita que su receta se env?e electr?nicamente a Chiropodist, informe a nuestra oficina a trav?s de MyChart de [place] o por tel?fono llamando al [phone] y presione la opci?n 4.  ?

## 2022-01-20 NOTE — Progress Notes (Signed)
? ?  Follow-Up Visit ?  ?Subjective  ?Briana Cummings is a 41 y.o. female who presents for the following: Rosacea (Face. Ran out of Soolantra and has been flaring. Itching/burning. Was clear for months after she stopped using Soolantra. She states she cut her tube open and has been able to use some for the past several weeks, has improved some but not resolved. Has also added Metronidazole 0.75% gel in the mornings). ? ? ? ?The following portions of the chart were reviewed this encounter and updated as appropriate:   ?  ? ?Review of Systems: No other skin or systemic complaints except as noted in HPI or Assessment and Plan. ? ? ?Objective  ?Well appearing patient in no apparent distress; mood and affect are within normal limits. ? ?A focused examination was performed including face. Relevant physical exam findings are noted in the Assessment and Plan. ? ?nose, chin, perioral ?Small pink papules perinasal ala and perioral ? ?face ?Mild erythema cheeks, chin, nose ? ? ?Assessment & Plan  ?Perioral dermatitis ?nose, chin, perioral ? ?Currently flared ? ?Start Doxycycline 100mg  take once daily. ? ?Doxycycline should be taken with food to prevent nausea. Do not lay down for 30 minutes after taking. Be cautious with sun exposure and use good sun protection while on this medication. Pregnant women should not take this medication.  ? ?Perioral dermatitis is an eruption which is usually located around the mouth and nose.  It can be a rash and/or red bumps.  It occasionally occurs around the eyes.  It may be itchy and may burn.  The exact cause is unknown.  Some types of makeup, moisturizers, dental products, and prescription creams may be partially responsible for the eruption.  Topical steroids such as cortisone creams can temporarily make the rash better but with discontinuation the rash tends to recur and worsen, so they should be avoided. Topical antibiotics, elidel cream, protopic ointment, and oral antibiotics may be  prescribed to treat this condition.  Although perioral dermatitis is not an infection, some antibiotics have anti-inflammatory properties that help it greatly.  ? ?Related Medications ?doxycycline (MONODOX) 100 MG capsule ?Take 1 capsule (100 mg total) by mouth daily. Take with food ? ?Rosacea ?face ? ?Chronic and persistent condition with duration or expected duration over one year. Condition is symptomatic/ bothersome to patient. Not currently at goal due to recent flare.  Improving on Soolantra. ? ?Rosacea is a chronic progressive skin condition usually affecting the face of adults, causing redness and/or acne bumps. It is treatable but not curable. It sometimes affects the eyes (ocular rosacea) as well. It may respond to topical and/or systemic medication and can flare with stress, sun exposure, alcohol, exercise and some foods.  Daily application of broad spectrum spf 30+ sunscreen to face is recommended to reduce flares. ? ?Continue Soolantra cream qhs  ?Continue metrogel 0.75% in AM ? ?Ivermectin (SOOLANTRA) 1 % CREA - face ?Apply qam to face, wash off qhs ? ? ?Return in about 1 year (around 01/21/2023) for Rosacea Follow Up. ? ?I, 01/23/2023, CMA, am acting as scribe for Lawson Radar, MD. ? ?Documentation: I have reviewed the above documentation for accuracy and completeness, and I agree with the above. ? ?Willeen Niece MD  ? ?

## 2022-01-21 ENCOUNTER — Other Ambulatory Visit: Payer: Self-pay

## 2022-01-21 MED ORDER — METRONIDAZOLE 0.75 % EX GEL
CUTANEOUS | 3 refills | Status: DC
Start: 2022-01-21 — End: 2022-01-27

## 2022-01-21 NOTE — Progress Notes (Signed)
Metronidazole gel Rf sent to pharmacy. JP ?

## 2022-01-26 ENCOUNTER — Ambulatory Visit: Payer: Self-pay

## 2022-01-26 NOTE — Telephone Encounter (Signed)
Patient has appointment tomorrow for OV and labs- patient has just started antibiotic from dermatology for rosacea and dermatitis. Patient wants to know if she can still get labs done- will the medication interfere? Advised patient it should be fine to have her annual labs done while on this medication- if this could be a problem- patient would like to reschedule for later date so she can have visit and labs in same day. Patient advised I would send message to provider for response and office would call her back. ?Reason for Disposition ? [1] Caller requesting NON-URGENT health information AND [2] PCP's office is the best resource ? ?Answer Assessment - Initial Assessment Questions ?1. REASON FOR CALL or QUESTION: "What is your reason for calling today?" or "How can I best help you?" or "What question do you have that I can help answer?" ?    Can annual labs be done if patient is using antibiotic? Doxycycline ? ?Protocols used: Information Only Call - No Triage-A-AH ? ?

## 2022-01-26 NOTE — Telephone Encounter (Signed)
Patient called, left VM to return the call to the office to discuss with a nurse.  ? ?Summary: medication question  ? Pt called in about her NP appt tomorrow 04/25, stating she was recently seen and was put on doxycycline (MONODOX) 100 MG capsule for 30 days. She stated she was suppose to be getting labs done and wanted to know how she should go about this medication and if she soul stop taking for tomorrow for the labs. *this is a NP appt, I didn't think labs were done on those, but she was sure that she was*   ?  ? ?

## 2022-01-26 NOTE — Progress Notes (Signed)
? ?[employer] as a Education administrator for Gwyneth Sprout, FNP.,have documented all relevant documentation on the behalf of Gwyneth Sprout, FNP,as directed by  Gwyneth Sprout, FNP while in the presence of Gwyneth Sprout, FNP.  ? ?Complete physical exam ? ? ?Patient: Briana Cummings   DOB: October 31, 1980   41 y.o. Female  MRN: [id number] ?Visit Date: 01/27/2022 ? ?Today's healthcare provider: Gwyneth Sprout, FNP  ?Patient presents for new patient visit to establish care.  Introduced to Designer, jewellery role and practice setting.  All questions answered.  Discussed provider/patient relationship and expectations. ? ? ?Chief Complaint  ?Patient presents with  ? Annual Exam  ? ?Subjective  ?  ? ?HPI  ?Briana Cummings is a 41 y.o. female who presents today for a complete physical exam.  ?She reports consuming a general diet. The patient does not participate in regular exercise at present. She generally feels well. She reports sleeping poorly, takes Tylenol PM . She does have additional problems to discuss today.  ? ?Last Reported ?Pap-12/16/20 ?Mammo-12/20/2018 ? ?Past Medical History:  ?Diagnosis Date  ? Family history of colon cancer   ? mom age 70; colonoscopy due age 87  ? GERD (gastroesophageal reflux disease)   ? HPV in female   ? Kidney stones   ? Rosacea   ? ?Past Surgical History:  ?Procedure Laterality Date  ? BREAST BIOPSY Right 11-21-14  ? PSEUDOANGIOMATOUS STROMAL HYPERPLASIA  ? BREAST LUMPECTOMY Right 2017  ? COLONOSCOPY  2011  ? because of family history  ? COLONOSCOPY WITH PROPOFOL N/A 02/04/2021  ? Procedure: COLONOSCOPY WITH PROPOFOL;  Surgeon: Jonathon Bellows, MD;  Location: [facility] ENDOSCOPY;  Service: Gastroenterology;  Laterality: N/A;  ? LITHOTRIPSY    ? WISDOM TOOTH EXTRACTION    ? ?Social History  ? ?Socioeconomic History  ? Marital status: Married  ?  Spouse name: Not on file  ? Number of children: Not on file  ? Years of education: Not on file  ? Highest education level: Not on file  ?Occupational History  ? Not  on file  ?Tobacco Use  ? Smoking status: Never  ? Smokeless tobacco: Never  ?Vaping Use  ? Vaping Use: Never used  ?Substance and Sexual Activity  ? Alcohol use: Yes  ?  Alcohol/week: 0.0 standard drinks  ?  Comment: occasionally  ? Drug use: No  ? Sexual activity: Yes  ?  Birth control/protection: None  ?Other Topics Concern  ? Not on file  ?Social History Narrative  ? Not on file  ? ?Social Determinants of Health  ? ?Financial Resource Strain: Not on file  ?Food Insecurity: Not on file  ?Transportation Needs: Not on file  ?Physical Activity: Not on file  ?Stress: Not on file  ?Social Connections: Not on file  ?Intimate Partner Violence: Not on file  ? ?Family Status  ?Relation Name Status  ? Mother  Alive  ? Father  Alive  ? PGM  Deceased  ? PGF  Deceased  ? Pat Culebra  ? Mat Uncle  Deceased  ? ?Family History  ?Problem Relation Age of Onset  ? Colon cancer Mother 75  ?     genetic negative  ? Cancer Paternal Grandmother   ?     lung primary, extensive mets  ? Cancer Paternal Grandfather   ?     lung  ? Heart attack Paternal Grandfather   ? Breast cancer Paternal Aunt 75  ? Prostate cancer Maternal Uncle   ? ?  Allergies  ?Allergen Reactions  ? Peanut Oil Itching  ? Amoxicillin Rash  ? Penicillins Rash  ?  ?Patient Care Team: ?Gwyneth Sprout, FNP as PCP - General (Family Medicine) ?Ward, Honor Loh, MD as Referring Physician (Obstetrics and Gynecology) ?Robert Bellow, MD (General Surgery)  ? ?Medications: ?Outpatient Medications Prior to Visit  ?Medication Sig  ? diphenhydramine-acetaminophen (TYLENOL PM) 25-500 MG TABS tablet Take 1 tablet by mouth at bedtime as needed.  ? doxycycline (MONODOX) 100 MG capsule Take 1 capsule (100 mg total) by mouth daily. Take with food  ? Ivermectin (SOOLANTRA) 1 % CREA Apply qam to face, wash off qhs  ? metroNIDAZOLE (METROGEL) 0.75 % gel Apply once or twice daily to face  ? VITAMIN D PO Take by mouth. (Patient not taking: Reported on 01/27/2022)  ? [DISCONTINUED]  brompheniramine-pseudoephedrine-DM 30-2-10 MG/5ML syrup Take 5 mLs by mouth 4 (four) times daily as needed.  ? [DISCONTINUED] oseltamivir (TAMIFLU) 75 MG capsule Take 1 capsule (75 mg total) by mouth 2 (two) times daily.  ? [DISCONTINUED] predniSONE (DELTASONE) 20 MG tablet Take 2 tablets (40 mg total) by mouth daily with breakfast.  ? ?No facility-administered medications prior to visit.  ? ? ?Review of Systems  ?All other systems reviewed and are negative. ? ? ? Objective  ? ?  ?BP 124/83   Pulse 80   Temp 98.4 ?F (36.9 ?C) (Oral)   Resp 16   Ht 5\' 6"  (1.676 m)   Wt 207 lb 12.8 oz (94.3 kg)   LMP  (Within Weeks)   SpO2 100%   BMI 33.54 kg/m?  ? ? ? ?Physical Exam ?Vitals and nursing note reviewed.  ?Constitutional:   ?   General: She is awake. She is not in acute distress. ?   Appearance: Normal appearance. She is well-developed and well-groomed. She is obese. She is not ill-appearing, toxic-appearing or diaphoretic.  ?HENT:  ?   Head: Normocephalic and atraumatic.  ?   Jaw: There is normal jaw occlusion. No trismus, tenderness, swelling or pain on movement.  ?   Right Ear: Hearing, tympanic membrane, ear canal and external ear normal. There is no impacted cerumen.  ?   Left Ear: Hearing, tympanic membrane, ear canal and external ear normal. There is no impacted cerumen.  ?   Nose: Nose normal. No congestion or rhinorrhea.  ?   Right Turbinates: Not enlarged, swollen or pale.  ?   Left Turbinates: Not enlarged, swollen or pale.  ?   Right Sinus: No maxillary sinus tenderness or frontal sinus tenderness.  ?   Left Sinus: No maxillary sinus tenderness or frontal sinus tenderness.  ?   Mouth/Throat:  ?   Lips: Pink.  ?   Mouth: Mucous membranes are moist. No injury.  ?   Tongue: No lesions.  ?   Pharynx: Oropharynx is clear. Uvula midline. No pharyngeal swelling, oropharyngeal exudate, posterior oropharyngeal erythema or uvula swelling.  ?   Tonsils: No tonsillar exudate or tonsillar abscesses.  ?Eyes:  ?    General: Lids are normal. Lids are everted, no foreign bodies appreciated. Vision grossly intact. Gaze aligned appropriately. No allergic shiner or visual field deficit.    ?   Right eye: No discharge.     ?   Left eye: No discharge.  ?   Extraocular Movements: Extraocular movements intact.  ?   Conjunctiva/sclera: Conjunctivae normal.  ?   Right eye: Right conjunctiva is not injected. No exudate. ?   Left  eye: Left conjunctiva is not injected. No exudate. ?   Pupils: Pupils are equal, round, and reactive to light.  ?Neck:  ?   Thyroid: No thyroid mass, thyromegaly or thyroid tenderness.  ?   Vascular: No carotid bruit.  ?   Trachea: Trachea normal.  ?Cardiovascular:  ?   Rate and Rhythm: Normal rate and regular rhythm.  ?   Pulses: Normal pulses.     ?     Carotid pulses are 2+ on the right side and 2+ on the left side. ?     Radial pulses are 2+ on the right side and 2+ on the left side.  ?     Dorsalis pedis pulses are 2+ on the right side and 2+ on the left side.  ?     Posterior tibial pulses are 2+ on the right side and 2+ on the left side.  ?   Heart sounds: Normal heart sounds, S1 normal and S2 normal. No murmur heard. ?  No friction rub. No gallop.  ?Pulmonary:  ?   Effort: Pulmonary effort is normal. No respiratory distress.  ?   Breath sounds: Normal breath sounds and air entry. No stridor. No wheezing, rhonchi or rales.  ?Chest:  ?   Chest wall: No tenderness.  ? ? ?   Comments: Breasts: breasts appear normal, no suspicious masses, no skin or nipple changes or axillary nodes, symmetric fibrous changes in both upper outer quadrants, right breast normal without mass, skin or nipple changes or axillary nodes, left breast normal without mass, skin or nipple changes or axillary nodes, surgical scar noted to R breast s/s previous R breast mass removal, risk and benefit of breast self-exam was discussed. Superficial fungal rash below L breast. ? ?Abdominal:  ?   General: Abdomen is flat. Bowel sounds are  normal. There is no distension.  ?   Palpations: Abdomen is soft. There is no mass.  ?   Tenderness: There is no abdominal tenderness. There is no right CVA tenderness, left CVA tenderness, guarding or rebound.  ?   Hernia:

## 2022-01-27 ENCOUNTER — Ambulatory Visit (INDEPENDENT_AMBULATORY_CARE_PROVIDER_SITE_OTHER): Payer: 59 | Admitting: Family Medicine

## 2022-01-27 ENCOUNTER — Encounter: Payer: Self-pay | Admitting: Family Medicine

## 2022-01-27 VITALS — BP 124/83 | HR 80 | Temp 98.4°F | Resp 16 | Ht 66.0 in | Wt 207.8 lb

## 2022-01-27 DIAGNOSIS — F5101 Primary insomnia: Secondary | ICD-10-CM

## 2022-01-27 DIAGNOSIS — Z131 Encounter for screening for diabetes mellitus: Secondary | ICD-10-CM | POA: Diagnosis not present

## 2022-01-27 DIAGNOSIS — F4010 Social phobia, unspecified: Secondary | ICD-10-CM | POA: Insufficient documentation

## 2022-01-27 DIAGNOSIS — Z0000 Encounter for general adult medical examination without abnormal findings: Secondary | ICD-10-CM

## 2022-01-27 DIAGNOSIS — E559 Vitamin D deficiency, unspecified: Secondary | ICD-10-CM

## 2022-01-27 DIAGNOSIS — G44209 Tension-type headache, unspecified, not intractable: Secondary | ICD-10-CM

## 2022-01-27 DIAGNOSIS — F40248 Other situational type phobia: Secondary | ICD-10-CM

## 2022-01-27 DIAGNOSIS — Z1159 Encounter for screening for other viral diseases: Secondary | ICD-10-CM | POA: Insufficient documentation

## 2022-01-27 DIAGNOSIS — B369 Superficial mycosis, unspecified: Secondary | ICD-10-CM

## 2022-01-27 DIAGNOSIS — Z114 Encounter for screening for human immunodeficiency virus [HIV]: Secondary | ICD-10-CM

## 2022-01-27 DIAGNOSIS — Z23 Encounter for immunization: Secondary | ICD-10-CM

## 2022-01-27 DIAGNOSIS — Z5320 Procedure and treatment not carried out because of patient's decision for unspecified reasons: Secondary | ICD-10-CM | POA: Insufficient documentation

## 2022-01-27 DIAGNOSIS — Z1231 Encounter for screening mammogram for malignant neoplasm of breast: Secondary | ICD-10-CM | POA: Diagnosis not present

## 2022-01-27 MED ORDER — KETOCONAZOLE 2 % EX CREA: 1.0000 "application " | TOPICAL_CREAM | Freq: Every day | CUTANEOUS | 3 refills | Status: DC | PRN

## 2022-01-27 MED ORDER — PROPRANOLOL HCL 20 MG PO TABS: ORAL_TABLET | ORAL | 11 refills | Status: DC

## 2022-01-27 MED ORDER — TRAZODONE HCL 50 MG PO TABS: 25.0000 mg | ORAL_TABLET | Freq: Every evening | ORAL | 3 refills | Status: DC | PRN

## 2022-01-27 MED ORDER — CYCLOBENZAPRINE HCL 5 MG PO TABS: 5.0000 mg | ORAL_TABLET | Freq: Every evening | ORAL | 1 refills | Status: DC | PRN

## 2022-01-27 NOTE — Assessment & Plan Note (Signed)
Low risk screen Treatable, and curable. If left untreated Hep C can lead to cirrhosis and liver failure. Encourage routine testing; recommend repeat testing if risk factors change.  

## 2022-01-27 NOTE — Assessment & Plan Note (Signed)
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

## 2022-01-27 NOTE — Assessment & Plan Note (Signed)
Chronic, stable ?Hx of Ibuprofen  ?Will provide HA log; trial of flexeril for high stress/tension days ?Denies OSA/snoring hx  ?Hx of dental grinding; however, failure to use mouth guard d/t dental chip/inability to close mouth ?Will refer to neuro if needed ?

## 2022-01-27 NOTE — Assessment & Plan Note (Signed)
Low risk screen ?Consented; encouraged to "know your status" ?Recommend repeat screen if risk factors change ? ?

## 2022-01-27 NOTE — Assessment & Plan Note (Signed)
Chronic, stable ?Wax/wanes ?Will try low dose trazodone ?Recommend sleep hygiene ?Pt is aware of risks of Tylenol PM medication ? ?

## 2022-01-27 NOTE — Assessment & Plan Note (Signed)
UTD on dental ?UTD on vision ?Due for mammo ?UTD on pap ?Due for colon ca screening +fam hx ?TDAP provided ?Things to do to keep yourself healthy  ?- Exercise at least 30-45 minutes a day, 3-4 days a week.  ?- Eat a low-fat diet with lots of fruits and vegetables, up to 7-9 servings per day.  ?- Seatbelts can save your life. Wear them always.  ?- Smoke detectors on every level of your home, check batteries every year.  ?- Eye Doctor - have an eye exam every 1-2 years  ?- Safe sex - if you may be exposed to STDs, use a condom.  ?- Alcohol -  If you drink, do it moderately, less than 2 drinks per day.  ?- Health Care Power of [place]. Choose someone to speak for you if you are not able.  ?- Depression is common in our stressful world.If you're feeling down or losing interest in things you normally enjoy, please come in for a visit.  ?- Violence - If anyone is threatening or hurting you, please call immediately. ? ? ?

## 2022-01-27 NOTE — Assessment & Plan Note (Signed)
Hx of obesity and weight gain ?Body mass index is 33.54 kg/m?. ?Will check A1c; denies family hx of DM ?Continue to recommend balanced, lower carb meals. Smaller meal size, adding snacks. Choosing water as drink of choice and increasing purposeful exercise. ? ?

## 2022-01-27 NOTE — Assessment & Plan Note (Signed)
Chronic, unstable- review of labs from 2020 ?Has been using supplement; unsure of quantity of IU ?Will recheck given weight gain and GAD s/s ?

## 2022-01-27 NOTE — Assessment & Plan Note (Signed)
Due for screening for mammogram, denies breast concerns, provided with phone number to call and schedule appointment for mammogram. Encouraged to repeat breast cancer screening every 1-2 years.  

## 2022-01-27 NOTE — Assessment & Plan Note (Signed)
Noted under L breast; acute, stable ?Topical cream provided ?F/u with derm if needed ?

## 2022-01-27 NOTE — Assessment & Plan Note (Signed)
Consent received; VIS provided ?Due in 10 years or sooner if injured ?

## 2022-01-28 ENCOUNTER — Other Ambulatory Visit: Payer: Self-pay | Admitting: Family Medicine

## 2022-01-28 DIAGNOSIS — E559 Vitamin D deficiency, unspecified: Secondary | ICD-10-CM

## 2022-01-28 LAB — CBC WITH DIFFERENTIAL/PLATELET
Basophils Absolute: 0 10*3/uL (ref 0.0–0.2)
Basos: 0 %
EOS (ABSOLUTE): 0.1 10*3/uL (ref 0.0–0.4)
Eos: 1 %
Hematocrit: 39.4 % (ref 34.0–46.6)
Hemoglobin: 13.2 g/dL (ref 11.1–15.9)
Immature Grans (Abs): 0 10*3/uL (ref 0.0–0.1)
Immature Granulocytes: 0 %
Lymphocytes Absolute: 1.8 10*3/uL (ref 0.7–3.1)
Lymphs: 26 %
MCH: 30.4 pg (ref 26.6–33.0)
MCHC: 33.5 g/dL (ref 31.5–35.7)
MCV: 91 fL (ref 79–97)
Monocytes Absolute: 0.6 10*3/uL (ref 0.1–0.9)
Monocytes: 9 %
Neutrophils Absolute: 4.3 10*3/uL (ref 1.4–7.0)
Neutrophils: 64 %
Platelets: 377 10*3/uL (ref 150–450)
RBC: 4.34 x10E6/uL (ref 3.77–5.28)
RDW: 11.8 % (ref 11.7–15.4)
WBC: 6.8 10*3/uL (ref 3.4–10.8)

## 2022-01-28 LAB — COMPREHENSIVE METABOLIC PANEL
ALT: 17 IU/L (ref 0–32)
AST: 20 IU/L (ref 0–40)
Albumin/Globulin Ratio: 2 (ref 1.2–2.2)
Albumin: 4.5 g/dL (ref 3.8–4.8)
Alkaline Phosphatase: 73 IU/L (ref 44–121)
BUN/Creatinine Ratio: 13 (ref 9–23)
BUN: 11 mg/dL (ref 6–24)
Bilirubin Total: 0.7 mg/dL (ref 0.0–1.2)
CO2: 20 mmol/L (ref 20–29)
Calcium: 9.3 mg/dL (ref 8.7–10.2)
Chloride: 104 mmol/L (ref 96–106)
Creatinine, Ser: 0.84 mg/dL (ref 0.57–1.00)
Globulin, Total: 2.3 g/dL (ref 1.5–4.5)
Glucose: 93 mg/dL (ref 70–99)
Potassium: 4.8 mmol/L (ref 3.5–5.2)
Sodium: 141 mmol/L (ref 134–144)
Total Protein: 6.8 g/dL (ref 6.0–8.5)
eGFR: 89 mL/min/{1.73_m2} (ref >59)

## 2022-01-28 LAB — VITAMIN D 25 HYDROXY (VIT D DEFICIENCY, FRACTURES): Vit D, 25-Hydroxy: 28.8 ng/mL — ABNORMAL LOW (ref 30.0–100.0)

## 2022-01-28 LAB — LIPID PANEL
Chol/HDL Ratio: 3.7 ratio (ref 0.0–4.4)
Cholesterol, Total: 212 mg/dL — ABNORMAL HIGH (ref 100–199)
HDL: 57 mg/dL (ref >39)
LDL Chol Calc (NIH): 133 mg/dL — ABNORMAL HIGH (ref 0–99)
Triglycerides: 122 mg/dL (ref 0–149)
VLDL Cholesterol Cal: 22 mg/dL (ref 5–40)

## 2022-01-28 LAB — TSH+FREE T4
Free T4: 1.2 ng/dL (ref 0.82–1.77)
TSH: 1.51 u[IU]/mL (ref 0.450–4.500)

## 2022-01-28 LAB — HEMOGLOBIN A1C
Est. average glucose Bld gHb Est-mCnc: 105 mg/dL
Hgb A1c MFr Bld: 5.3 % (ref 4.8–5.6)

## 2022-01-28 LAB — HEPATITIS C ANTIBODY: Hep C Virus Ab: NONREACTIVE

## 2022-01-28 LAB — HIV ANTIBODY (ROUTINE TESTING W REFLEX): HIV Screen 4th Generation wRfx: NONREACTIVE

## 2022-01-28 MED ORDER — VITAMIN D (ERGOCALCIFEROL) 1.25 MG (50000 UNIT) PO CAPS: 50000.0000 [IU] | ORAL_CAPSULE | ORAL | 3 refills | Status: DC

## 2022-02-19 ENCOUNTER — Other Ambulatory Visit: Payer: Self-pay | Admitting: Family Medicine

## 2022-02-19 DIAGNOSIS — F5101 Primary insomnia: Secondary | ICD-10-CM

## 2022-02-19 NOTE — Telephone Encounter (Signed)
Requested medication (s) are due for refill today: no  Requested medication (s) are on the active medication list: yes  Last refill:  01/27/22 #30 3 RF  Future visit scheduled: next year 01/2023  Notes to clinic:  pt requesting 90 day refills- had CPE 3 weeks ago   Requested Prescriptions  Pending Prescriptions Disp Refills   traZODone (DESYREL) 50 MG tablet [Pharmacy Med Name: TRAZODONE 50 MG TABLET] 90 tablet 2    Sig: TAKE 0.5-1 TABLETS BY MOUTH AT BEDTIME AS NEEDED FOR SLEEP.     Psychiatry: Antidepressants - Serotonin Modulator Failed - 02/19/2022  1:34 PM      Failed - Valid encounter within last 6 months    Recent Outpatient Visits           3 weeks ago Annual physical exam   [facility] Jacky Kindle, FNP   3 years ago Bronchitis   [facility] Osvaldo Angst M, [place]   3 years ago Acute non-recurrent frontal sinusitis   [facility] Trey Sailors, [place]       Future Appointments             In 11 months Willeen Niece, MD [facility]   In 11 months Jacky Kindle, FNP [facility]

## 2022-03-26 ENCOUNTER — Other Ambulatory Visit: Payer: Self-pay | Admitting: Dermatology

## 2022-03-26 DIAGNOSIS — L710 Perioral dermatitis: Secondary | ICD-10-CM

## 2023-01-26 ENCOUNTER — Ambulatory Visit (INDEPENDENT_AMBULATORY_CARE_PROVIDER_SITE_OTHER): Payer: Managed Care, Other (non HMO) | Admitting: Dermatology

## 2023-01-26 VITALS — BP 122/79 | HR 76

## 2023-01-26 DIAGNOSIS — L710 Perioral dermatitis: Secondary | ICD-10-CM | POA: Diagnosis not present

## 2023-01-26 DIAGNOSIS — L821 Other seborrheic keratosis: Secondary | ICD-10-CM

## 2023-01-26 DIAGNOSIS — L304 Erythema intertrigo: Secondary | ICD-10-CM | POA: Diagnosis not present

## 2023-01-26 DIAGNOSIS — L719 Rosacea, unspecified: Secondary | ICD-10-CM

## 2023-01-26 MED ORDER — DOXYCYCLINE MONOHYDRATE 100 MG PO CAPS: 100.0000 mg | ORAL_CAPSULE | Freq: Every day | ORAL | 11 refills | Status: AC

## 2023-01-26 MED ORDER — KETOCONAZOLE 2 % EX CREA: TOPICAL_CREAM | CUTANEOUS | 11 refills | Status: AC

## 2023-01-26 MED ORDER — IVERMECTIN 1 % EX CREA: TOPICAL_CREAM | CUTANEOUS | 11 refills | Status: DC

## 2023-01-26 NOTE — Progress Notes (Signed)
Follow-Up Visit   Subjective  Briana Cummings is a 42 y.o. female who presents for the following: 1 year follow up on rosacea and perioral dermatitis.  Would like to discuss rash under left breast has been using rx of ketoconazole cream to affected areas qd. Needs rfs   The following portions of the chart were reviewed this encounter and updated as appropriate: medications, allergies, medical history  Review of Systems:  No other skin or systemic complaints except as noted in HPI or Assessment and Plan.  Objective  Well appearing patient in no apparent distress; mood and affect are within normal limits.   A focused examination was performed of the following areas: Scalp, face, chest  Relevant exam findings are noted in the Assessment and Plan.  Right Temple  Stuck-on, waxy, tan-brown papule            Assessment & Plan   Perioral dermatitis  perioral erythema with small pink papules left inferior nostril and chin next to lower lip   Currently flared   Continue Doxycycline  take once daily with food   Doxycycline should be taken with food to prevent nausea. Do not lay down for 30 minutes after taking. Be cautious with sun exposure and use good sun protection while on this medication. Pregnant women should not take this medication.    Perioral dermatitis is an eruption which is usually located around the mouth and nose.  It can be a rash and/or red bumps.  It occasionally occurs around the eyes.  It may be itchy and may burn.  The exact cause is unknown.  Some types of makeup, moisturizers, dental products, and prescription creams may be partially responsible for the eruption.  Topical steroids such as cortisone creams can temporarily make the rash better but with discontinuation the rash tends to recur and worsen, so they should be avoided. Topical antibiotics, elidel cream, protopic ointment, and oral antibiotics may be prescribed to treat this condition.   Although perioral dermatitis is not an infection, some antibiotics have anti-inflammatory properties that help it greatly.   Rosacea Face  Mild erythema at malar cheeks and  chin   Chronic and persistent condition with duration or expected duration over one year. Condition is symptomatic/ bothersome to patient. Not currently at goal due to recent flare.  Has been improving on Soolantra.   Rosacea is a chronic progressive skin condition usually affecting the face of adults, causing redness and/or acne bumps. It is treatable but not curable. It sometimes affects the eyes (ocular rosacea) as well. It may respond to topical and/or systemic medication and can flare with stress, sun exposure, alcohol, exercise and some foods.  Daily application of broad spectrum spf 30+ sunscreen to face is recommended to reduce flares.   Continue Soolantra cream qhs  Continue metrogel 0.75% in AM use when flared   INTERTRIGO Exam mild erythema at left inframammary   Chronic and persistent condition with duration or expected duration over one year. Condition is bothersome/symptomatic for patient.   Intertrigo is a chronic recurrent rash that occurs in skin fold areas that may be associated with friction; heat; moisture; yeast; fungus; and bacteria.  It is exacerbated by increased movement / activity; sweating; and higher atmospheric temperature.  Treatment Plan Continue Ketoconazole cream - apply to affected areas of rash qhs if flared can use twice daily   Recommend OTC Zeasorb AF powder to body folds daily after shower.  It is often found in the athlete's foot section  in the pharmacy.  Avoid using powders that contain cornstarch.    Seborrheic keratosis Right Temple  Reassured benign age-related growth.  Recommend observation.  Discussed cryotherapy if spot(s) become irritated or inflamed.  Asymptomatic but patient would like treated  Discussed cosmetic procedure (cryotherapy), noncovered.  $60 for 1st  lesion and $15 for each additional lesion if done on the same day.  Maximum charge $350.  One touch-up treatment included no charge. Discussed risks of treatment including dyspigmentation, small scar, and/or recurrence. Recommend daily broad spectrum sunscreen SPF 30+/photoprotection to treated areas once healed.   Destruction of lesion - Right Temple  Destruction method: cryotherapy   Informed consent: discussed and consent obtained   Lesion destroyed using liquid nitrogen: Yes   Region frozen until ice ball extended beyond lesion: Yes   Outcome: patient tolerated procedure well with no complications   Post-procedure details: wound care instructions given   Additional details:  Prior to procedure, discussed risks of blister formation, small wound, skin dyspigmentation, or rare scar following cryotherapy. Recommend Vaseline ointment to treated areas while healing.   Rosacea  Related Medications Ivermectin (SOOLANTRA) 1 % CREA Apply qam to face, wash off qhs  Perioral dermatitis  Related Medications doxycycline (MONODOX) 100 MG capsule Take 1 capsule (100 mg total) by mouth daily. Take with food  Erythema intertrigo  Related Medications ketoconazole (NIZORAL) 2 % cream Apply qd/bid prn for rash at L breast    Return in about 1 year (around 01/26/2024) for rosacea/perioral derm .  I, Asher Muir, CMA, am acting as scribe for Willeen Niece, MD.   Documentation: I have reviewed the above documentation for accuracy and completeness, and I agree with the above.  Willeen Niece, MD

## 2023-01-26 NOTE — Patient Instructions (Addendum)
Can continue Ketoconzole cream - apply to affected areas of rash at body nightly if flared can use twice daily   Recommend OTC Zeasorb AF powder to body folds daily after shower.  It is often found in the athlete's foot section in the pharmacy.  Avoid using powders that contain cornstarch.    Intertrigo Intertrigo is skin irritation (inflammation) that happens in warm, moist areas of the body. The irritation can cause a rash and make skin raw and itchy. The rash is usually pink or red. It happens mostly between folds of skin or where skin rubs together, such as: In the armpits. Under the breasts. Under the belly. In the groin area. Around the butt area. Between the toes. This condition is not passed from person to person. What are the causes? Heat, moisture, rubbing, and not enough air movement. The condition can be made worse by: Sweat. Bacteria. A fungus, such as yeast. What increases the risk? Moisture in your skin folds. You are more likely to develop this condition if you: Are not able to move around. Live in a warm and moist climate. Are not able to control your pee (urine) or poop (stool). Wear splints, braces, or other medical devices. Are overweight. Have diabetes. What are the signs or symptoms? A pink or red skin rash in a skin fold or near a skin fold. Raw or scaly skin. Itching. A burning feeling. Bleeding. Leaking fluid. A bad smell. How is this treated? Cleaning and drying your skin. Taking an antibiotic medicine or using an antibiotic skin cream for a bacterial infection. Using an antifungal cream on your skin or taking pills for an infection that was caused by a fungus, such as yeast. Using a steroid ointment to stop the itching and irritation. Separating the skin fold with a clean cotton cloth to absorb moisture and allow air to flow into the area. Follow these instructions at home: Keep the affected area clean and dry. Do not scratch your  skin. Stay cool as much as you can. Use an air conditioner or a fan, if you have one. Apply over-the-counter and prescription medicines only as told by your doctor. If you were prescribed antibiotics, use them as told by your doctor. Do not stop using the antibiotic even if you start to feel better. Keep all follow-up visits. Your doctor may need to check your skin to make sure that the treatment is working. How is this prevented? Shower and dry yourself well after being active. Use a hair dryer on a cool setting to dry between skin folds. Do not wear tight clothes. Wear clothes that: Are loose. Take moisture away from your body. Are made of cotton. Wear a bra that gives good support, if needed. Protect the skin in your groin and butt area as told by your doctor. To do this: Follow a regular cleaning routine. Use creams, powders, or ointments that protect your skin. Change protection pads often. Stay at a healthy weight. Take care of your feet. This is very important if you have diabetes. You should: Wear shoes that fit well. Keep your feet dry. Wear clean cotton or wool socks. Keep your blood sugar under control if you have diabetes. Contact a doctor if: Your symptoms do not get better with treatment. Your symptoms get worse or they spread. You notice more redness and warmth. You have a fever. This information is not intended to replace advice given to you by your health care provider. Make sure  you discuss any questions you have with your health care provider. Document Revised: 02/12/2022 Document Reviewed: 02/12/2022 Elsevier Patient Education  2023 [employer]       Seborrheic Keratosis  What causes seborrheic keratoses? Seborrheic keratoses are harmless, common skin growths that first appear during adult life.  As time goes by, more growths appear.  Some people may develop a large number of them.  Seborrheic keratoses appear on both covered and uncovered body parts.   They are not caused by sunlight.  The tendency to develop seborrheic keratoses can be inherited.  They vary in color from skin-colored to gray, brown, or even black.  They can be either smooth or have a rough, warty surface.   Seborrheic keratoses are superficial and look as if they were stuck on the skin.  Under the microscope this type of keratosis looks like layers upon layers of skin.  That is why at times the top layer may seem to fall off, but the rest of the growth remains and re-grows.    Treatment Seborrheic keratoses do not need to be treated, but can easily be removed in the office.  Seborrheic keratoses often cause symptoms when they rub on clothing or jewelry.  Lesions can be in the way of shaving.  If they become inflamed, they can cause itching, soreness, or burning.  Removal of a seborrheic keratosis can be accomplished by freezing, burning, or surgery. If any spot bleeds, scabs, or grows rapidly, please return to have it checked, as these can be an indication of a skin cancer.   Melanoma ABCDEs  Melanoma is the most dangerous type of skin cancer, and is the leading cause of death from skin disease.  You are more likely to develop melanoma if you: Have light-colored skin, light-colored eyes, or red or blond hair Spend a lot of time in the sun Tan regularly, either outdoors or in a tanning bed Have had blistering sunburns, especially during childhood Have a close family member who has had a melanoma Have atypical moles or large birthmarks  Early detection of melanoma is key since treatment is typically straightforward and cure rates are extremely high if we catch it early.   The first sign of melanoma is often a change in a mole or a new dark spot.  The ABCDE system is a way of remembering the signs of melanoma.  A for asymmetry:  The two halves do not match. B for border:  The edges of the growth are irregular. C for color:  A mixture of colors are present instead of an even  brown color. D for diameter:  Melanomas are usually (but not always) greater than 6mm - the size of a pencil eraser. E for evolution:  The spot keeps changing in size, shape, and color.  Please check your skin once per month between visits. You can use a small mirror in front and a large mirror behind you to keep an eye on the back side or your body.   If you see any new or changing lesions before your next follow-up, please call to schedule a visit.  Please continue daily skin protection including broad spectrum sunscreen SPF 30+ to sun-exposed areas, reapplying every 2 hours as needed when you're outdoors.   Staying in the shade or wearing long sleeves, sun glasses (UVA+UVB protection) and wide brim hats (4-inch brim around the entire circumference of the hat) are also recommended for sun protection.    Due to recent changes in healthcare  laws, you may see results of your pathology and/or laboratory studies on MyChart before the doctors have had a chance to review them. We understand that in some cases there may be results that are confusing or concerning to you. Please understand that not all results are received at the same time and often the doctors may need to interpret multiple results in order to provide you with the best plan of care or course of treatment. Therefore, we ask that you please give [place] 2 business days to thoroughly review all your results before contacting the office for clarification. Should we see a critical lab result, you will be contacted sooner.   If You Need Anything After Your Visit  If you have any questions or concerns for your doctor, please call our main line at [phone] and press option 4 to reach your doctor's medical assistant. If no one answers, please leave a voicemail as directed and we will return your call as soon as possible. Messages left after 4 pm will be answered the following business day.   You may also send [place] a message via MyChart. We  typically respond to MyChart messages within 1-2 business days.  For prescription refills, please ask your pharmacy to contact our office. Our fax number is [phone].  If you have an urgent issue when the clinic is closed that cannot wait until the next business day, you can page your doctor at the number below.    Please note that while we do our best to be available for urgent issues outside of office hours, we are not available 24/7.   If you have an urgent issue and are unable to reach [place], you may choose to seek medical care at your doctor's office, retail clinic, urgent care center, or emergency room.  If you have a medical emergency, please immediately call 911 or go to the emergency department.  Pager Numbers  - Dr. Gwen Pounds: [phone]  - Dr. Neale Burly: [phone]  - Dr. Roseanne Reno: [phone]  In the event of inclement weather, please call our main line at [phone] for an update on the status of any delays or closures.  Dermatology Medication Tips: Please keep the boxes that topical medications come in in order to help keep track of the instructions about where and how to use these. Pharmacies typically print the medication instructions only on the boxes and not directly on the medication tubes.   If your medication is too expensive, please contact our office at [phone] option 4 or send [place] a message through MyChart.   We are unable to tell what your co-pay for medications will be in advance as this is different depending on your insurance coverage. However, we may be able to find a substitute medication at lower cost or fill out paperwork to get insurance to cover a needed medication.   If a prior authorization is required to get your medication covered by your insurance company, please allow [place] 1-2 business days to complete this process.  Drug prices often vary depending on where the prescription is filled and some pharmacies may offer cheaper prices.  The  website www.goodrx.com contains coupons for medications through different pharmacies. The prices here do not account for what the cost may be with help from insurance (it may be cheaper with your insurance), but the website can give you the price if you did not use any insurance.  - You can print the associated coupon and take it with your prescription to the pharmacy.  -  You may also stop by our office during regular business hours and pick up a GoodRx coupon card.  - If you need your prescription sent electronically to a different pharmacy, notify our office through [facility] or by phone at [phone] option 4.     Si Usted Necesita Algo Despus de Su Visita  Tambin puede enviarnos un mensaje a travs de Clinical cytogeneticist. Por lo general respondemos a los mensajes de MyChart en el transcurso de 1 a 2 das hbiles.  Para renovar recetas, por favor pida a su farmacia que se ponga en contacto con nuestra oficina. Annie Sable de fax es [place] [phone].  Si tiene un asunto urgente cuando la clnica est cerrada y que no puede esperar hasta el siguiente da hbil, puede llamar/localizar a su doctor(a) al nmero que aparece a continuacin.   Por favor, tenga en cuenta que aunque hacemos todo lo posible para estar disponibles para asuntos urgentes fuera del horario de [place], no estamos disponibles las 24 horas del da, los 7 [address]  [address] de la [place].   Si tiene un problema urgente y no puede comunicarse con nosotros, puede optar por buscar atencin mdica  en el consultorio de su doctor(a), en una clnica privada, en un centro de atencin urgente o en una sala de emergencias.  Si tiene Engineer, drilling, por favor llame inmediatamente al 911 o vaya a la sala de emergencias.  Nmeros de bper  - Dr. Gwen Pounds: [phone]  - Dra. Moye: [phone]  - Dra. Roseanne Reno: [phone]  En caso de inclemencias del [place], por favor llame a Lacy Duverney principal al [phone] para una  actualizacin sobre el [place] de cualquier retraso o cierre.  Consejos para la medicacin en dermatologa: Por favor, guarde las cajas en las que vienen los medicamentos de uso tpico para ayudarle a seguir las instrucciones sobre dnde y cmo usarlos. Las farmacias generalmente imprimen las instrucciones del medicamento slo en las cajas y no directamente en los tubos del [place].   Si su medicamento es muy caro, por favor, pngase en contacto con Rolm Gala llamando al [phone] y presione la opcin 4 o envenos un mensaje a travs de Clinical cytogeneticist.   No podemos decirle cul ser su copago por los medicamentos por adelantado ya que esto es diferente dependiendo de la cobertura de su seguro. Sin embargo, es posible que podamos encontrar un medicamento sustituto a Audiological scientist un formulario para que el seguro cubra el medicamento que se considera necesario.   Si se requiere una autorizacin previa para que su compaa de seguros [place] su medicamento, por favor permtanos de 1 a 2 das hbiles para completar [address]  Los precios de los medicamentos varan con frecuencia dependiendo del Environmental consultant de dnde se surte la receta y alguna farmacias pueden ofrecer precios ms baratos.  El sitio web www.goodrx.com tiene cupones para medicamentos de Health and safety inspector. Los precios aqu no tienen en cuenta lo que podra costar con la ayuda del seguro (puede ser ms barato con su seguro), pero el sitio web puede darle el precio si no utiliz Tourist information centre manager.  - Puede imprimir el cupn correspondiente y llevarlo con su receta a la farmacia.  - Tambin puede pasar por nuestra oficina durante el horario de atencin regular y Education officer, museum una tarjeta de cupones de GoodRx.  - Si necesita que su receta se enve electrnicamente a Psychiatrist, informe a nuestra oficina a travs de MyChart de [place] o por telfono llamando al [phone] y presione  la opcin 4.

## 2023-01-29 ENCOUNTER — Encounter: Payer: 59 | Admitting: Family Medicine

## 2023-02-09 ENCOUNTER — Encounter: Payer: Self-pay | Admitting: Family Medicine

## 2023-02-09 ENCOUNTER — Ambulatory Visit (INDEPENDENT_AMBULATORY_CARE_PROVIDER_SITE_OTHER): Payer: Managed Care, Other (non HMO) | Admitting: Family Medicine

## 2023-02-09 VITALS — BP 116/75 | HR 80 | Temp 98.2°F | Resp 16 | Ht 66.0 in | Wt 207.9 lb

## 2023-02-09 DIAGNOSIS — Z1231 Encounter for screening mammogram for malignant neoplasm of breast: Secondary | ICD-10-CM | POA: Diagnosis not present

## 2023-02-09 DIAGNOSIS — E559 Vitamin D deficiency, unspecified: Secondary | ICD-10-CM | POA: Diagnosis not present

## 2023-02-09 DIAGNOSIS — Z0000 Encounter for general adult medical examination without abnormal findings: Secondary | ICD-10-CM

## 2023-02-09 DIAGNOSIS — N951 Menopausal and female climacteric states: Secondary | ICD-10-CM | POA: Diagnosis not present

## 2023-02-09 MED ORDER — PAROXETINE HCL 10 MG PO TABS
10.0000 mg | ORAL_TABLET | Freq: Every day | ORAL | 2 refills | Status: DC
Start: 2023-02-09 — End: 2023-05-10

## 2023-02-09 NOTE — Assessment & Plan Note (Signed)
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

## 2023-02-09 NOTE — Assessment & Plan Note (Signed)
Due for mammogram UTD on Colon- due 2027 Has GYN for PAP Things to do to keep yourself healthy  - Exercise at least 30-45 minutes a day, 3-4 days a week.  - Eat a low-fat diet with lots of fruits and vegetables, up to 7-9 servings per day.  - Seatbelts can save your life. Wear them always.  - Smoke detectors on every level of your home, check batteries every year.  - Eye Doctor - have an eye exam every 1-2 years  - Safe sex - if you may be exposed to STDs, use a condom.  - Alcohol -  If you drink, do it moderately, less than 2 drinks per day.  - Health Care Power of Attorney. Choose someone to speak for you if you are not able.  - Depression is common in our stressful world.If you're feeling down or losing interest in things you normally enjoy, please come in for a visit.  - Violence - If anyone is threatening or hurting you, please call immediately. Due for vision UTD on dental

## 2023-02-09 NOTE — Progress Notes (Signed)
I,Sulibeya S Dimas,acting as a Neurosurgeon for Briana Kindle, FNP.,have documented all relevant documentation on the behalf of Briana Kindle, FNP,as directed by  Briana Kindle, FNP while in the presence of Briana Kindle, FNP.  Complete physical exam  Patient: Briana Cummings   DOB: July 30, 1981   42 y.o. Female  MRN: [id number] Visit Date: 02/09/2023  Today's healthcare provider: Jacky Kindle, FNP  Re Introduced to nurse practitioner role and practice setting.  All questions answered.  Discussed provider/patient relationship and expectations.  Chief Complaint  Patient presents with   Annual Exam   Subjective    Breigh Mckeag is a 42 y.o. female who presents today for a complete physical exam.  She reports consuming a general diet. The patient does not participate in regular exercise at present. She generally feels fairly well. She reports sleeping poorly. She does have additional problems to discuss today.  HPI    Past Medical History:  Diagnosis Date   Family history of colon cancer    mom age 88; colonoscopy due age 48   GERD (gastroesophageal reflux disease)    HPV in female    Kidney stones    Rosacea    Past Surgical History:  Procedure Laterality Date   BREAST BIOPSY Right 11-21-14   PSEUDOANGIOMATOUS STROMAL HYPERPLASIA   BREAST LUMPECTOMY Right 2017   COLONOSCOPY  2011   because of family history   COLONOSCOPY WITH PROPOFOL N/A 02/04/2021   Procedure: COLONOSCOPY WITH PROPOFOL;  Surgeon: Wyline Mood, MD;  Location: [facility] ENDOSCOPY;  Service: Gastroenterology;  Laterality: N/A;   LITHOTRIPSY     WISDOM TOOTH EXTRACTION     Social History   Socioeconomic History   Marital status: Married    Spouse name: Not on file   Number of children: Not on file   Years of education: Not on file   Highest education level: Not on file  Occupational History   Not on file  Tobacco Use   Smoking status: Never   Smokeless tobacco: Never  Vaping Use   Vaping Use: Never used   Substance and Sexual Activity   Alcohol use: Yes    Alcohol/week: 0.0 standard drinks of alcohol    Comment: occasionally   Drug use: No   Sexual activity: Yes    Birth control/protection: None  Other Topics Concern   Not on file  Social History Narrative   Not on file   Social Determinants of Health   Financial Resource Strain: Not on file  Food Insecurity: Not on file  Transportation Needs: Not on file  Physical Activity: Unknown (02/21/2018)   Exercise Vital Sign    Days of Exercise per Week: 0 days    Minutes of Exercise per Session: Not on file  Stress: Not on file  Social Connections: Not on file  Intimate Partner Violence: Not on file   Family Status  Relation Name Status   Mother  Alive   Father  Alive   PGM  Deceased   PGF  Deceased   Pat Aunt  Alive   Mat Uncle  Deceased   Family History  Problem Relation Age of Onset   Colon cancer Mother 3       genetic negative   Cancer Paternal Grandmother        lung primary, extensive mets   Cancer Paternal Grandfather        lung   Heart attack Paternal Grandfather    Breast cancer Paternal Aunt 63  Prostate cancer Maternal Uncle    Allergies  Allergen Reactions   Peanut Oil Itching   Amoxicillin Rash   Penicillins Rash    Patient Care Team: Briana Kindle, FNP as PCP - General (Family Medicine) Ward, Elenora Fender, MD as Referring Physician (Obstetrics and Gynecology) Earline Mayotte, MD (General Surgery)   Medications: Outpatient Medications Prior to Visit  Medication Sig   doxycycline (MONODOX) 100 MG capsule Take 1 capsule (100 mg total) by mouth daily. Take with food   Ivermectin (SOOLANTRA) 1 % CREA Apply qam to face, wash off qhs   ketoconazole (NIZORAL) 2 % cream Apply qd/bid prn for rash at L breast   Vitamin D, Ergocalciferol, (DRISDOL) 1.25 MG (50000 UNIT) CAPS capsule Take 1 capsule (50,000 Units total) by mouth every 7 (seven) days.   [DISCONTINUED] diphenhydramine-acetaminophen (TYLENOL  PM) 25-500 MG TABS tablet Take 1 tablet by mouth at bedtime as needed.   [DISCONTINUED] VITAMIN D PO Take by mouth.   [DISCONTINUED] cyclobenzaprine (FLEXERIL) 5 MG tablet Take 1 tablet (5 mg total) by mouth at bedtime as needed (tension headaches). (Patient not taking: Reported on 02/09/2023)   [DISCONTINUED] propranolol (INDERAL) 20 MG tablet Daily as needed for anxiety/elevated heart rate. (Patient not taking: Reported on 02/09/2023)   [DISCONTINUED] traZODone (DESYREL) 50 MG tablet TAKE 0.5-1 TABLETS BY MOUTH AT BEDTIME AS NEEDED FOR SLEEP. (Patient not taking: Reported on 02/09/2023)   No facility-administered medications prior to visit.    Review of Systems  All other systems reviewed and are negative.   Last CBC Lab Results  Component Value Date   WBC 6.8 01/27/2022   HGB 13.2 01/27/2022   HCT 39.4 01/27/2022   MCV 91 01/27/2022   MCH 30.4 01/27/2022   RDW 11.8 01/27/2022   PLT 377 01/27/2022   Last metabolic panel Lab Results  Component Value Date   GLUCOSE 93 01/27/2022   NA 141 01/27/2022   K 4.8 01/27/2022   CL 104 01/27/2022   CO2 20 01/27/2022   BUN 11 01/27/2022   CREATININE 0.84 01/27/2022   EGFR 89 01/27/2022   CALCIUM 9.3 01/27/2022   PROT 6.8 01/27/2022   ALBUMIN 4.5 01/27/2022   LABGLOB 2.3 01/27/2022   AGRATIO 2.0 01/27/2022   BILITOT 0.7 01/27/2022   ALKPHOS 73 01/27/2022   AST 20 01/27/2022   ALT 17 01/27/2022   ANIONGAP 7 06/07/2013   Last lipids Lab Results  Component Value Date   CHOL 212 (H) 01/27/2022   HDL 57 01/27/2022   LDLCALC 133 (H) 01/27/2022   TRIG 122 01/27/2022   CHOLHDL 3.7 01/27/2022   Last hemoglobin A1c Lab Results  Component Value Date   HGBA1C 5.3 01/27/2022   Last thyroid functions Lab Results  Component Value Date   TSH 1.510 01/27/2022   Last vitamin D Lab Results  Component Value Date   VD25OH 28.8 (L) 01/27/2022      Objective    BP 116/75 (BP Location: Left Arm, Patient Position: Sitting, Cuff Size:  Large)   Pulse 80   Temp 98.2 F (36.8 C) (Temporal)   Resp 16   Ht 5\' 6"  (1.676 m)   Wt 207 lb 14.4 oz (94.3 kg)   LMP 01/19/2023 (Exact Date)   BMI 33.56 kg/m   BP Readings from Last 3 Encounters:  02/09/23 116/75  01/26/23 122/79  01/27/22 124/83   Wt Readings from Last 3 Encounters:  02/09/23 207 lb 14.4 oz (94.3 kg)  01/27/22 207 lb 12.8 oz (  94.3 kg)  02/04/21 200 lb (90.7 kg)   Physical Exam Vitals and nursing note reviewed.  Constitutional:      General: She is awake. She is not in acute distress.    Appearance: Normal appearance. She is well-developed and well-groomed. She is obese. She is not ill-appearing, toxic-appearing or diaphoretic.  HENT:     Head: Normocephalic and atraumatic.     Jaw: There is normal jaw occlusion. No trismus, tenderness, swelling or pain on movement.     Right Ear: Hearing, tympanic membrane, ear canal and external ear normal. There is no impacted cerumen.     Left Ear: Hearing, tympanic membrane, ear canal and external ear normal. There is no impacted cerumen.     Nose: Nose normal. No congestion or rhinorrhea.     Right Turbinates: Not enlarged, swollen or pale.     Left Turbinates: Not enlarged, swollen or pale.     Right Sinus: No maxillary sinus tenderness or frontal sinus tenderness.     Left Sinus: No maxillary sinus tenderness or frontal sinus tenderness.     Mouth/Throat:     Lips: Pink.     Mouth: Mucous membranes are moist. No injury.     Tongue: No lesions.     Pharynx: Oropharynx is clear. Uvula midline. No pharyngeal swelling, oropharyngeal exudate, posterior oropharyngeal erythema or uvula swelling.     Tonsils: No tonsillar exudate or tonsillar abscesses.  Eyes:     General: Lids are normal. Lids are everted, no foreign bodies appreciated. Vision grossly intact. Gaze aligned appropriately. No allergic shiner or visual field deficit.       Right eye: No discharge.        Left eye: No discharge.     Extraocular Movements:  Extraocular movements intact.     Conjunctiva/sclera: Conjunctivae normal.     Right eye: Right conjunctiva is not injected. No exudate.    Left eye: Left conjunctiva is not injected. No exudate.    Pupils: Pupils are equal, round, and reactive to light.  Neck:     Thyroid: No thyroid mass, thyromegaly or thyroid tenderness.     Vascular: No carotid bruit.     Trachea: Trachea normal.  Cardiovascular:     Rate and Rhythm: Normal rate and regular rhythm.     Pulses: Normal pulses.          Carotid pulses are 2+ on the right side and 2+ on the left side.      Radial pulses are 2+ on the right side and 2+ on the left side.       Dorsalis pedis pulses are 2+ on the right side and 2+ on the left side.       Posterior tibial pulses are 2+ on the right side and 2+ on the left side.     Heart sounds: Normal heart sounds, S1 normal and S2 normal. No murmur heard.    No friction rub. No gallop.  Pulmonary:     Effort: Pulmonary effort is normal. No respiratory distress.     Breath sounds: Normal breath sounds and air entry. No stridor. No wheezing, rhonchi or rales.  Chest:     Chest wall: No tenderness.  Abdominal:     General: Abdomen is flat. Bowel sounds are normal. There is no distension.     Palpations: Abdomen is soft. There is no mass.     Tenderness: There is no abdominal tenderness. There is no right CVA tenderness, left CVA  tenderness, guarding or rebound.     Hernia: No hernia is present.  Genitourinary:    Comments: Exam deferred; denies complaints Musculoskeletal:        General: No swelling, tenderness, deformity or signs of injury. Normal range of motion.     Cervical back: Full passive range of motion without pain, normal range of motion and neck supple. No edema, rigidity or tenderness. No muscular tenderness.     Right lower leg: No edema.     Left lower leg: No edema.  Lymphadenopathy:     Cervical: No cervical adenopathy.     Right cervical: No superficial, deep or  posterior cervical adenopathy.    Left cervical: No superficial, deep or posterior cervical adenopathy.  Skin:    General: Skin is warm and dry.     Capillary Refill: Capillary refill takes less than 2 seconds.     Coloration: Skin is not jaundiced or pale.     Findings: No bruising, erythema, lesion or rash.  Neurological:     General: No focal deficit present.     Mental Status: She is alert and oriented to person, place, and time. Mental status is at baseline.     GCS: GCS eye subscore is 4. GCS verbal subscore is 5. GCS motor subscore is 6.     Sensory: Sensation is intact. No sensory deficit.     Motor: Motor function is intact. No weakness.     Coordination: Coordination is intact. Coordination normal.     Gait: Gait is intact. Gait normal.  Psychiatric:        Attention and Perception: Attention and perception normal.        Mood and Affect: Mood and affect normal.        Speech: Speech normal.        Behavior: Behavior normal. Behavior is cooperative.        Thought Content: Thought content normal.        Cognition and Memory: Cognition and memory normal.        Judgment: Judgment normal.     Last depression screening scores    02/09/2023   10:53 AM 01/27/2022    1:36 PM 12/16/2020    9:30 AM  PHQ 2/9 Scores  PHQ - 2 Score 0 0 0  PHQ- 9 Score 5 0 2   Last fall risk screening    02/09/2023   10:53 AM  Fall Risk   Falls in the past year? 1  Number falls in past yr: 0  Injury with Fall? 0  Risk for fall due to : No Fall Risks  Follow up Falls evaluation completed   Last Audit-C alcohol use screening    02/09/2023   10:54 AM  Alcohol Use Disorder Test (AUDIT)  1. How often do you have a drink containing alcohol? 1  2. How many drinks containing alcohol do you have on a typical day when you are drinking? 0  3. How often do you have six or more drinks on one occasion? 0  AUDIT-C Score 1   A score of 3 or more in women, and 4 or more in men indicates increased risk  for alcohol abuse, EXCEPT if all of the points are from question 1   No results found for any visits on 02/09/23.  Assessment & Plan    Routine Health Maintenance and Physical Exam  Exercise Activities and Dietary recommendations  Goals   None     Immunization History  Administered Date(s) Administered   Tdap 01/27/2022    Health Maintenance  Topic Date Due   COVID-19 Vaccine (1) Never done   INFLUENZA VACCINE  05/06/2023   PAP SMEAR-Modifier  12/17/2023   COLONOSCOPY (Pts 45-63yrs Insurance coverage will need to be confirmed)  02/04/2026   DTaP/Tdap/Td (2 - Td or Tdap) 01/28/2032   Hepatitis C Screening  Completed   HIV Screening  Completed   HPV VACCINES  Aged Out    Discussed health benefits of physical activity, and encouraged her to engage in regular exercise appropriate for her age and condition.  Problem List Items Addressed This Visit       Other   Annual physical exam - Primary    Due for mammogram UTD on Colon- due 2027 Has GYN for PAP Things to do to keep yourself healthy  - Exercise at least 30-45 minutes a day, 3-4 days a week.  - Eat a low-fat diet with lots of fruits and vegetables, up to 7-9 servings per day.  - Seatbelts can save your life. Wear them always.  - Smoke detectors on every level of your home, check batteries every year.  - Eye Doctor - have an eye exam every 1-2 years  - Safe sex - if you may be exposed to STDs, use a condom.  - Alcohol -  If you drink, do it moderately, less than 2 drinks per day.  - Health Care Power of Attorney. Choose someone to speak for you if you are not able.  - Depression is common in our stressful world.If you're feeling down or losing interest in things you normally enjoy, please come in for a visit.  - Violence - If anyone is threatening or hurting you, please call immediately. Due for vision UTD on dental       Relevant Orders   Comprehensive metabolic panel   Lipid Panel With LDL/HDL Ratio   VITAMIN  D 25 Hydroxy (Vit-D Deficiency, Fractures)   Avitaminosis D    Chronic, previously low On weekly supplement  Repeat labs       Relevant Orders   VITAMIN D 25 Hydroxy (Vit-D Deficiency, Fractures)   Encounter for screening mammogram for malignant neoplasm of breast    Due for screening for mammogram, denies breast concerns, provided with phone number to call and schedule appointment for mammogram. Encouraged to repeat breast cancer screening every 1-2 years.       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    Leilani Merl, FNP, have reviewed all documentation for this visit. The documentation on 02/09/23 for the exam, diagnosis, procedures, and orders are all accurate and complete.  Briana Kindle, FNP  [facility] [phone] (phone) [phone] (fax)  [facility]

## 2023-02-09 NOTE — Patient Instructions (Signed)
Please call and schedule your mammogram:  [facility]  [address], [address] [facility] [place],  [place]  [address] Get Driving Directions Main: [phone]  Sunday:Closed Monday:7:20 AM - 5:00 PM Tuesday:7:20 AM - 5:00 PM Wednesday:7:20 AM - 5:00 PM Thursday:7:20 AM - 5:00 PM Friday:7:20 AM - 4:30 PM Saturday:Closed  

## 2023-02-09 NOTE — Assessment & Plan Note (Signed)
Chronic, previously low On weekly supplement  Repeat labs

## 2023-02-09 NOTE — Assessment & Plan Note (Signed)
Due for screening for mammogram, denies breast concerns, provided with phone number to call and schedule appointment for mammogram. Encouraged to repeat breast cancer screening every 1-2 years.  

## 2023-02-10 ENCOUNTER — Other Ambulatory Visit: Payer: Self-pay | Admitting: Family Medicine

## 2023-02-10 DIAGNOSIS — E559 Vitamin D deficiency, unspecified: Secondary | ICD-10-CM

## 2023-02-10 LAB — COMPREHENSIVE METABOLIC PANEL
ALT: 8 IU/L (ref 0–32)
AST: 14 IU/L (ref 0–40)
Albumin/Globulin Ratio: 2.8 — ABNORMAL HIGH (ref 1.2–2.2)
Albumin: 4.5 g/dL (ref 3.9–4.9)
Alkaline Phosphatase: 72 IU/L (ref 44–121)
BUN/Creatinine Ratio: 11 (ref 9–23)
BUN: 11 mg/dL (ref 6–24)
Bilirubin Total: 1 mg/dL (ref 0.0–1.2)
CO2: 20 mmol/L (ref 20–29)
Calcium: 9.1 mg/dL (ref 8.7–10.2)
Chloride: 103 mmol/L (ref 96–106)
Creatinine, Ser: 0.96 mg/dL (ref 0.57–1.00)
Globulin, Total: 1.6 g/dL (ref 1.5–4.5)
Glucose: 97 mg/dL (ref 70–99)
Potassium: 4.8 mmol/L (ref 3.5–5.2)
Sodium: 137 mmol/L (ref 134–144)
Total Protein: 6.1 g/dL (ref 6.0–8.5)
eGFR: 75 mL/min/{1.73_m2} (ref >59)

## 2023-02-10 LAB — LIPID PANEL WITH LDL/HDL RATIO
Cholesterol, Total: 213 mg/dL — ABNORMAL HIGH (ref 100–199)
HDL: 50 mg/dL (ref >39)
LDL Chol Calc (NIH): 143 mg/dL — ABNORMAL HIGH (ref 0–99)
LDL/HDL Ratio: 2.9 ratio (ref 0.0–3.2)
Triglycerides: 110 mg/dL (ref 0–149)
VLDL Cholesterol Cal: 20 mg/dL (ref 5–40)

## 2023-02-10 LAB — VITAMIN D 25 HYDROXY (VIT D DEFICIENCY, FRACTURES): Vit D, 25-Hydroxy: 25.9 ng/mL — ABNORMAL LOW (ref 30.0–100.0)

## 2023-02-10 MED ORDER — VITAMIN D (ERGOCALCIFEROL) 1.25 MG (50000 UNIT) PO CAPS
50000.0000 [IU] | ORAL_CAPSULE | ORAL | 0 refills | Status: DC
Start: 2023-02-10 — End: 2023-12-20

## 2023-02-10 NOTE — Progress Notes (Signed)
Slight increase in LDL/bad cholesterol. However, low risk of ASCVD. I continue to recommend diet low in saturated fat and regular exercise - 30 min at least 5 times per week  The 10-year ASCVD risk score (Arnett DK, et al., 2019) is: 0.8% Vit D remains low; will resend high dose weekly supplement.

## 2023-03-04 ENCOUNTER — Other Ambulatory Visit: Payer: Self-pay | Admitting: Family Medicine

## 2023-03-04 DIAGNOSIS — N951 Menopausal and female climacteric states: Secondary | ICD-10-CM

## 2023-03-04 NOTE — Telephone Encounter (Signed)
Requested Prescriptions  Pending Prescriptions Disp Refills   PARoxetine (PAXIL) 10 MG tablet [Pharmacy Med Name: PAROXETINE HCL 10 MG TABLET] 90 tablet 1    Sig: TAKE 1 TABLET BY MOUTH EVERY DAY     Psychiatry:  Antidepressants - SSRI Passed - 03/04/2023 10:31 AM      Passed - Valid encounter within last 6 months    Recent Outpatient Visits           3 weeks ago Annual physical exam   [facility] Jacky Kindle, FNP   1 year ago Annual physical exam   [facility] Jacky Kindle, FNP   4 years ago Bronchitis   [facility] [place], [place], [place]   4 years ago Acute non-recurrent frontal sinusitis   [facility] Trey Sailors, [place]       Future Appointments             In 11 months Willeen Niece, MD [facility]

## 2023-03-15 ENCOUNTER — Telehealth: Payer: Managed Care, Other (non HMO) | Admitting: Physician Assistant

## 2023-03-15 DIAGNOSIS — L03213 Periorbital cellulitis: Secondary | ICD-10-CM

## 2023-03-15 MED ORDER — CEPHALEXIN 500 MG PO CAPS
500.0000 mg | ORAL_CAPSULE | Freq: Four times a day (QID) | ORAL | 0 refills | Status: DC
Start: 2023-03-15 — End: 2023-10-27

## 2023-03-15 NOTE — Patient Instructions (Signed)
[name], thank you for joining [name], PA-C for today's virtual visit.  While this provider is not your primary care provider (PCP), if your PCP is located in our provider database this encounter information will be shared with them immediately following your visit.   A [facility] MyChart account gives you access to today's visit and all your visits, tests, and labs performed at [facility] " click here if you don't have a [facility] MyChart account or go to [email or web address]  Consent: (Patient) Shandel Briegel provided verbal consent for this virtual visit at the beginning of the encounter.  Current Medications:  Current Outpatient Medications:    cephALEXin (KEFLEX) 500 MG capsule, Take 1 capsule (500 mg total) by mouth 4 (four) times daily., Disp: 20 capsule, Rfl: 0   doxycycline (MONODOX) 100 MG capsule, Take 1 capsule (100 mg total) by mouth daily. Take with food, Disp: 30 capsule, Rfl: 11   Ivermectin (SOOLANTRA) 1 % CREA, Apply qam to face, wash off qhs, Disp: 45 g, Rfl: 11   ketoconazole (NIZORAL) 2 % cream, Apply qd/bid prn for rash at L breast, Disp: 30 g, Rfl: 11   PARoxetine (PAXIL) 10 MG tablet, Take 1 tablet (10 mg total) by mouth daily., Disp: 30 tablet, Rfl: 2   Vitamin D, Ergocalciferol, (DRISDOL) 1.25 MG (50000 UNIT) CAPS capsule, Take 1 capsule (50,000 Units total) by mouth every 7 (seven) days., Disp: 52 capsule, Rfl: 0   Medications ordered in this encounter:  Meds ordered this encounter  Medications   cephALEXin (KEFLEX) 500 MG capsule    Sig: Take 1 capsule (500 mg total) by mouth 4 (four) times daily.    Dispense:  20 capsule    Refill:  0    Order Specific Question:   Supervising Provider    Answer:   Merrilee Jansky [id number]     *If you need refills on other medications prior to your next appointment, please contact your pharmacy*  Follow-Up: Call back or seek an in-person evaluation if the symptoms worsen or if  the condition fails to improve as anticipated.  [facility] [phone]  Other Instructions Preseptal Cellulitis, Adult Preseptal cellulitis is an infection of the eyelid and the tissues around the eye (periorbital area). The infection causes painful swelling and redness. This condition may also be called periorbital cellulitis. In most cases, the condition can be treated with antibiotic medicine at home. It is important to treat preseptal cellulitis right away so that it does not get worse. If it gets worse, it can spread to the eye socket and eye muscles (orbital cellulitis). Orbital cellulitis is a medical emergency. What are the causes? Preseptal cellulitis is most commonly caused by bacteria. In rare cases, it can be caused by a virus or fungus. The germs that cause preseptal cellulitis may come from: A sinus infection that spreads near the eyes. An injury near the eye, such as a scratch, puncture wound, animal bite, or insect bite. A skin rash, such as eczema or poison ivy, that becomes infected. An infected pimple on the eyelid (stye). Infection after eyelid surgery or injury. What increases the risk? You are more likely to develop this condition if: You have a weakened disease-fighting system (immune system). You have a medical condition that raises your risk for sinus infections, such as nasal polyps. What are the signs or symptoms? Symptoms of this condition include: Eyelids that are red and swollen and feel unusually hot. Fever. Difficulty opening  the eye. Headache. Pain in the face. Symptoms of this condition usually develop suddenly. How is this diagnosed? This condition may be diagnosed based on your symptoms, your medical history, and an eye exam. You may also have tests, such as: Blood tests. Tests (cultures) to find out which specific bacteria are causing the infection. You may have a culture of any open wound or drainage. CT scan. MRI. This is less  common. How is this treated? This condition is treated with antibiotic medicines. These may be given by mouth (orally), through an IV, or as an injection. In rare cases, you may need surgery to drain an infected area. Follow these instructions at home: Medicines Take your antibiotic medicine as told by your health care provider. Do not stop taking the antibiotic even if you start to feel better Take over-the-counter and prescription medicines only as told by your health care provider. Eye Care Do not use eye drops without first getting approval from your health care provider. Do not touch or rub your eye. If you wear contact lenses, do not wear them until your health care provider approves. Keep the eye area clean and dry. Wash the eye area with a clean washcloth, warm water, and baby shampoo or mild soap. To help relieve discomfort, place a clean washcloth that is wet with warm water over your eye. Leave the washcloth on for a few minutes, then remove it. General instructions Wash your hands with soap and water often for at least 20 seconds. If soap and water are not available, use hand sanitizer. Do not use any products that contain nicotine or tobacco, such as cigarettes, e-cigarettes, and chewing tobacco. If you need help quitting, ask your health care provider. Drink enough fluid to keep your urine pale yellow. Do not drive or operate machinery until your health care provider says that it is safe. Ask your health care provider if it is safe for you to drive. Stay up to date on your vaccinations. Keep all follow-up visits. This includes any visits with an eye specialist (ophthalmologist) or dentist. This is important. Get help right away if: You have new symptoms. Your symptoms get worse or do not get better with treatment. You have a fever. Your vision becomes blurry or gets worse in any way. Your eye looks like it is sticking out or bulging out (proptosis). You develop double  vision. You have trouble moving your eyes or pain when moving your eyes You have a severe headache. You have neck stiffness or severe neck pain. These symptoms may represent a serious problem that is an emergency. Do not wait to see if the symptoms will go away. Get medical help right away. Call your local emergency services (911 in the [place] Do not drive yourself to the hospital. Summary Preseptal cellulitis is an infection of the eyelid and the tissues around the eye. Symptoms of preseptal cellulitis usually develop suddenly and include red and swollen eyelids, fever, difficulty opening the eye, headache, and facial pain. This condition is treated with antibiotic medicines. Do not stop taking the antibiotic even if you start to feel better. Preseptal cellulitis can develop into orbital cellulitis, which is a medical emergency. If your condition does not improve or worsens, visit your heath care provider right away. This information is not intended to replace advice given to you by your health care provider. Make sure you discuss any questions you have with your health care provider. Document Revised: 01/24/2020 Document Reviewed: 01/24/2020 Elsevier Patient Education  2024 Elsevier Inc.    If you have been instructed to have an in-person evaluation today at a local Urgent Care facility, please use the link below. It will take you to a list of all of our available [facility], including address, phone number and hours of operation. Please do not delay care.  [facility]  If you or a family member do not have a primary care provider, use the link below to schedule a visit and establish care. When you choose a [facility] primary care physician or advanced practice provider, you gain a long-term partner in health. Find a Primary Care Provider  Learn more about [facility]'s in-office and virtual care options: [facility] - Get Care Now

## 2023-03-15 NOTE — Progress Notes (Signed)
Virtual Visit Consent   Briana Cummings, you are scheduled for a virtual visit with a [facility] provider today. Just as with appointments in the office, your consent must be obtained to participate. Your consent will be active for this visit and any virtual visit you may have with one of our providers in the next 365 days. If you have a MyChart account, a copy of this consent can be sent to you electronically.  As this is a virtual visit, video technology does not allow for your provider to perform a traditional examination. This may limit your provider's ability to fully assess your condition. If your provider identifies any concerns that need to be evaluated in person or the need to arrange testing (such as labs, EKG, etc.), we will make arrangements to do so. Although advances in technology are sophisticated, we cannot ensure that it will always work on either your end or our end. If the connection with a video visit is poor, the visit may have to be switched to a telephone visit. With either a video or telephone visit, we are not always able to ensure that we have a secure connection.  By engaging in this virtual visit, you consent to the provision of healthcare and authorize for your insurance to be billed (if applicable) for the services provided during this visit. Depending on your insurance coverage, you may receive a charge related to this service.  I need to obtain your verbal consent now. Are you willing to proceed with your visit today? Briana Cummings has provided verbal consent on 03/15/2023 for a virtual visit (video or telephone). [name], PA-C  Date: 03/15/2023 8:14 AM  Virtual Visit via Video Note   I, [name], connected with  Briana Cummings  ([id number], 03-29-1981) on 03/15/23 at  8:00 AM EDT by a video-enabled telemedicine application and verified that I am speaking with the correct person using two identifiers.  Location: Patient: Virtual Visit Location  Patient: Home Provider: Virtual Visit Location Provider: Home Office   I discussed the limitations of evaluation and management by telemedicine and the availability of in person appointments. The patient expressed understanding and agreed to proceed.    History of Present Illness: Briana Cummings is a 42 y.o. who identifies as a female who was assigned female at birth, and is being seen today for swelling around left eye and left upper eyelid. Used new make-up and caused her face to break out some. On Friday, 03/12/23, she popped a "white-head bump" in the left medial eyebrow. Noticed Friday and Saturday (06/08) was just more tender to touch. Then Sunday (06/09) she noticed there was more tenderness and mild swelling over the eyebrow. This morning she awoke and noticed the swelling was spreading and now spread into the left upper eyelid area. There is some redness. No warmth noted. Still tender to touch. Denies fevers, chills, nausea, vomiting, headaches, visual changes. She is on Doxycycline daily for Rosacea.   Problems:  Patient Active Problem List   Diagnosis Date Noted   Perimenopausal 02/09/2023   Annual physical exam 01/27/2022   Encounter for screening mammogram for malignant neoplasm of breast 01/27/2022   Avitaminosis D 01/27/2022    Allergies:  Allergies  Allergen Reactions   Peanut Oil Itching   Amoxicillin Rash   Penicillins Rash   Medications:  Current Outpatient Medications:    cephALEXin (KEFLEX) 500 MG capsule, Take 1 capsule (500 mg total) by mouth 4 (four) times daily., Disp: 20 capsule, Rfl:  0   doxycycline (MONODOX) 100 MG capsule, Take 1 capsule (100 mg total) by mouth daily. Take with food, Disp: 30 capsule, Rfl: 11   Ivermectin (SOOLANTRA) 1 % CREA, Apply qam to face, wash off qhs, Disp: 45 g, Rfl: 11   ketoconazole (NIZORAL) 2 % cream, Apply qd/bid prn for rash at L breast, Disp: 30 g, Rfl: 11   PARoxetine (PAXIL) 10 MG tablet, Take 1 tablet (10 mg total) by  mouth daily., Disp: 30 tablet, Rfl: 2   Vitamin D, Ergocalciferol, (DRISDOL) 1.25 MG (50000 UNIT) CAPS capsule, Take 1 capsule (50,000 Units total) by mouth every 7 (seven) days., Disp: 52 capsule, Rfl: 0  Observations/Objective: Patient is well-developed, well-nourished in no acute distress.  Resting comfortably at home.  Head is normocephalic, atraumatic.  No labored breathing.  Speech is clear and coherent with logical content.  Patient is alert and oriented at baseline.  Left upper eyelid mildly swollen with some redness from eyebrow through eyelid. Mildly tender to touch per patient. Does not affect vision  Assessment and Plan: 1. Preseptal cellulitis of left upper eyelid - cephALEXin (KEFLEX) 500 MG capsule; Take 1 capsule (500 mg total) by mouth 4 (four) times daily.  Dispense: 20 capsule; Refill: 0  - Suspect preseptal cellulitis - Keflex prescribed - Cold compresses - Tylenol and Ibuprofen as needed - Seek in person evaluation if symptoms continue to worsen or fail to resolve  Follow Up Instructions: I discussed the assessment and treatment plan with the patient. The patient was provided an opportunity to ask questions and all were answered. The patient agreed with the plan and demonstrated an understanding of the instructions.  A copy of instructions were sent to the patient via MyChart unless otherwise noted below.    The patient was advised to call back or seek an in-person evaluation if the symptoms worsen or if the condition fails to improve as anticipated.  Time:  I spent 10 minutes with the patient via telehealth technology discussing the above problems/concerns.    [name], PA-C

## 2023-03-18 ENCOUNTER — Ambulatory Visit
Admission: RE | Admit: 2023-03-18 | Discharge: 2023-03-18 | Disposition: A | Discharge status: Home / Self Care | Payer: Managed Care, Other (non HMO) | Source: Ambulatory Visit | Attending: Family Medicine | Admitting: Family Medicine

## 2023-03-18 DIAGNOSIS — Z1231 Encounter for screening mammogram for malignant neoplasm of breast: Secondary | ICD-10-CM | POA: Insufficient documentation

## 2023-03-23 NOTE — Progress Notes (Signed)
Hi Corisa  Normal mammogram; repeat in 1 year.  Please let us know if you have any questions.  Thank you,  Ahmani Daoud, FNP 

## 2023-05-08 ENCOUNTER — Other Ambulatory Visit: Payer: Self-pay | Admitting: Family Medicine

## 2023-05-08 DIAGNOSIS — N951 Menopausal and female climacteric states: Secondary | ICD-10-CM

## 2023-05-10 NOTE — Telephone Encounter (Signed)
Requested Prescriptions  Pending Prescriptions Disp Refills   PARoxetine (PAXIL) 10 MG tablet [Pharmacy Med Name: PAROXETINE HCL 10 MG TABLET] 90 tablet 0    Sig: TAKE 1 TABLET BY MOUTH EVERY DAY     Psychiatry:  Antidepressants - SSRI Passed - 05/08/2023  1:14 AM      Passed - Valid encounter within last 6 months    Recent Outpatient Visits           3 months ago Annual physical exam   [facility] Jacky Kindle, FNP   1 year ago Annual physical exam   [facility] Jacky Kindle, FNP   4 years ago Bronchitis   [facility], [place], [place]   5 years ago Acute non-recurrent frontal sinusitis   [facility] Trey Sailors, [place]       Future Appointments             In 8 months Willeen Niece, MD [facility]

## 2023-05-28 ENCOUNTER — Ambulatory Visit (INDEPENDENT_AMBULATORY_CARE_PROVIDER_SITE_OTHER): Payer: Managed Care, Other (non HMO) | Admitting: Family Medicine

## 2023-05-28 ENCOUNTER — Encounter: Payer: Self-pay | Admitting: Family Medicine

## 2023-05-28 VITALS — BP 113/71 | HR 92 | Ht 66.0 in | Wt 210.0 lb

## 2023-05-28 DIAGNOSIS — G5702 Lesion of sciatic nerve, left lower limb: Secondary | ICD-10-CM | POA: Diagnosis not present

## 2023-05-28 MED ORDER — METHOCARBAMOL 750 MG PO TABS: 750.0000 mg | ORAL_TABLET | Freq: Two times a day (BID) | ORAL | 2 refills | Status: DC | PRN

## 2023-05-28 MED ORDER — CELECOXIB 200 MG PO CAPS: 200.0000 mg | ORAL_CAPSULE | Freq: Two times a day (BID) | ORAL | 2 refills | Status: DC

## 2023-05-28 NOTE — Progress Notes (Signed)
Established patient visit   Patient: Briana Cummings   DOB: Jun 27, 1981   42 y.o. Female  MRN: [id number] Visit Date: 05/28/2023  Today's healthcare provider: Jacky Kindle, FNP  Introduced to nurse practitioner role and practice setting.  All questions answered.  Discussed provider/patient relationship and expectations.  Subjective    Hip Pain    HPI     Hip Pain    Additional comments: Started July 5th and lingering. Gradually getting better but still causing pain. Located on left side. Patient reports using ice pack with relief and ibuprofen       Last edited by Acey Lav, CMA on 05/28/2023  8:23 AM.      Medications: Outpatient Medications Prior to Visit  Medication Sig   cephALEXin (KEFLEX) 500 MG capsule Take 1 capsule (500 mg total) by mouth 4 (four) times daily.   doxycycline (MONODOX) 100 MG capsule Take 1 capsule (100 mg total) by mouth daily. Take with food   Ivermectin (SOOLANTRA) 1 % CREA Apply qam to face, wash off qhs   ketoconazole (NIZORAL) 2 % cream Apply qd/bid prn for rash at L breast   PARoxetine (PAXIL) 10 MG tablet TAKE 1 TABLET BY MOUTH EVERY DAY   Vitamin D, Ergocalciferol, (DRISDOL) 1.25 MG (50000 UNIT) CAPS capsule Take 1 capsule (50,000 Units total) by mouth every 7 (seven) days.   No facility-administered medications prior to visit.     Objective    BP 113/71 (BP Location: Left Arm, Patient Position: Sitting, Cuff Size: Normal)   Pulse 92   Ht 5\' 6"  (1.676 m)   Wt 210 lb (95.3 kg)   SpO2 100%   BMI 33.89 kg/m   Physical Exam Vitals and nursing note reviewed.  Constitutional:      General: She is not in acute distress.    Appearance: Normal appearance. She is obese. She is not ill-appearing, toxic-appearing or diaphoretic.  HENT:     Head: Normocephalic and atraumatic.  Cardiovascular:     Rate and Rhythm: Normal rate and regular rhythm.     Pulses: Normal pulses.     Heart sounds: Normal heart sounds. No murmur heard.    No  friction rub. No gallop.  Pulmonary:     Effort: Pulmonary effort is normal. No respiratory distress.     Breath sounds: Normal breath sounds. No stridor. No wheezing, rhonchi or rales.  Chest:     Chest wall: No tenderness.  Musculoskeletal:        General: Tenderness present. No swelling, deformity or signs of injury. Normal range of motion.     Right lower leg: No edema.     Left lower leg: No edema.  Skin:    General: Skin is warm and dry.     Capillary Refill: Capillary refill takes less than 2 seconds.     Coloration: Skin is not jaundiced or pale.     Findings: No bruising, erythema, lesion or rash.  Neurological:     General: No focal deficit present.     Mental Status: She is alert and oriented to person, place, and time. Mental status is at baseline.     Cranial Nerves: No cranial nerve deficit.     Sensory: No sensory deficit.     Motor: No weakness.     Coordination: Coordination normal.  Psychiatric:        Mood and Affect: Mood normal.        Behavior: Behavior normal.  Thought Content: Thought content normal.        Judgment: Judgment normal.     No results found for any visits on 05/28/23.  Assessment & Plan     Problem List Items Addressed This Visit       Nervous and Auditory   Piriformis syndrome of left side - Primary    Acute x 6 weeks Not improved with PRN NSAIDs and ice Recommend scheduled NSAIDs and stretching Use of PRN muscle relaxants to assist F/u as needed for sports med or PT referrals       Relevant Medications   methocarbamol (ROBAXIN-750) 750 MG tablet   Return in about 4 weeks (around 06/25/2023), or if symptoms worsen or fail to improve.     Leilani Merl, FNP, have reviewed all documentation for this visit. The documentation on 05/28/23 for the exam, diagnosis, procedures, and orders are all accurate and complete.  Jacky Kindle, FNP  [facility] [phone] (phone) [phone]  (fax)  [facility]

## 2023-05-28 NOTE — Assessment & Plan Note (Signed)
Acute x 6 weeks Not improved with PRN NSAIDs and ice Recommend scheduled NSAIDs and stretching Use of PRN muscle relaxants to assist F/u as needed for sports med or PT referrals

## 2023-10-27 ENCOUNTER — Ambulatory Visit: Payer: Managed Care, Other (non HMO) | Admitting: Dermatology

## 2023-10-27 ENCOUNTER — Encounter: Payer: Self-pay | Admitting: Dermatology

## 2023-10-27 DIAGNOSIS — D1801 Hemangioma of skin and subcutaneous tissue: Secondary | ICD-10-CM | POA: Diagnosis not present

## 2023-10-27 DIAGNOSIS — Z1283 Encounter for screening for malignant neoplasm of skin: Secondary | ICD-10-CM

## 2023-10-27 DIAGNOSIS — D224 Melanocytic nevi of scalp and neck: Secondary | ICD-10-CM

## 2023-10-27 DIAGNOSIS — D2361 Other benign neoplasm of skin of right upper limb, including shoulder: Secondary | ICD-10-CM

## 2023-10-27 DIAGNOSIS — L821 Other seborrheic keratosis: Secondary | ICD-10-CM

## 2023-10-27 DIAGNOSIS — D492 Neoplasm of unspecified behavior of bone, soft tissue, and skin: Secondary | ICD-10-CM

## 2023-10-27 DIAGNOSIS — D225 Melanocytic nevi of trunk: Secondary | ICD-10-CM

## 2023-10-27 DIAGNOSIS — D239 Other benign neoplasm of skin, unspecified: Secondary | ICD-10-CM

## 2023-10-27 DIAGNOSIS — Z86018 Personal history of other benign neoplasm: Secondary | ICD-10-CM

## 2023-10-27 DIAGNOSIS — L814 Other melanin hyperpigmentation: Secondary | ICD-10-CM

## 2023-10-27 DIAGNOSIS — L578 Other skin changes due to chronic exposure to nonionizing radiation: Secondary | ICD-10-CM | POA: Diagnosis not present

## 2023-10-27 DIAGNOSIS — L719 Rosacea, unspecified: Secondary | ICD-10-CM | POA: Diagnosis not present

## 2023-10-27 DIAGNOSIS — D485 Neoplasm of uncertain behavior of skin: Secondary | ICD-10-CM

## 2023-10-27 DIAGNOSIS — W908XXA Exposure to other nonionizing radiation, initial encounter: Secondary | ICD-10-CM

## 2023-10-27 DIAGNOSIS — D229 Melanocytic nevi, unspecified: Secondary | ICD-10-CM

## 2023-10-27 DIAGNOSIS — D2272 Melanocytic nevi of left lower limb, including hip: Secondary | ICD-10-CM

## 2023-10-27 MED ORDER — IVERMECTIN 1 % EX CREA
TOPICAL_CREAM | CUTANEOUS | 11 refills | Status: AC
Start: 2023-10-27 — End: ?

## 2023-10-27 NOTE — Progress Notes (Signed)
Follow-Up Visit   Subjective  Briana Cummings is a 43 y.o. female who presents for the following: Skin Cancer Screening and Full Body Skin Exam  The patient presents for Total-Body Skin Exam (TBSE) for skin cancer screening and mole check. The patient has spots, moles and lesions to be evaluated, some may be new or changing. She has a pink spot on her right shoulder present for months that scabs, and a spot on her left abdomen to check .History of dysplastic nevus.     The following portions of the chart were reviewed this encounter and updated as appropriate: medications, allergies, medical history  Review of Systems:  No other skin or systemic complaints except as noted in HPI or Assessment and Plan.  Objective  Well appearing patient in no apparent distress; mood and affect are within normal limits.  A full examination was performed including scalp, head, eyes, ears, nose, lips, neck, chest, axillae, abdomen, back, buttocks, bilateral upper extremities, bilateral lower extremities, hands, feet, fingers, toes, fingernails, and toenails. All findings within normal limits unless otherwise noted below.   Relevant physical exam findings are noted in the Assessment and Plan.  central upper abdomen 5.0 mm red papule  Assessment & Plan   SKIN CANCER SCREENING PERFORMED TODAY.  ACTINIC DAMAGE - Chronic condition, secondary to cumulative UV/sun exposure - diffuse scaly erythematous macules with underlying dyspigmentation - Recommend daily broad spectrum sunscreen SPF 30+ to sun-exposed areas, reapply every 2 hours as needed.  - Staying in the shade or wearing long sleeves, sun glasses (UVA+UVB protection) and wide brim hats (4-inch brim around the entire circumference of the hat) are also recommended for sun protection.  - Call for new or changing lesions.  LENTIGINES, SEBORRHEIC KERATOSES, HEMANGIOMAS (R postauricular scalp) - Benign normal skin lesions - Benign-appearing - Call for  any changes  MELANOCYTIC NEVI - Tan-brown and/or pink-flesh-colored symmetric macules and papules - R post lower neck   4.0 mm med brown macule - L upper abdomen  3.0 mm med brown macule - L lower lateral thigh  3.0 mm med brown macule with small notch - Benign appearing on exam today - Observation - Call clinic for new or changing moles - Recommend daily use of broad spectrum spf 30+ sunscreen to sun-exposed areas.   HISTORY OF DYSPLASTIC NEVUS Left anterior side - No evidence of recurrence today - Recommend regular full body skin exams - Recommend daily broad spectrum sunscreen SPF 30+ to sun-exposed areas, reapply every 2 hours as needed.  - Call if any new or changing lesions are noted between office visits  ROSACEA Exam Mid face erythema with telangiectasias.   Chronic and persistent condition with duration or expected duration over one year. Condition is improving with treatment but not currently at goal.   Rosacea is a chronic progressive skin condition usually affecting the face of adults, causing redness and/or acne bumps. It is treatable but not curable. It sometimes affects the eyes (ocular rosacea) as well. It may respond to topical and/or systemic medication and can flare with stress, sun exposure, alcohol, exercise, topical steroids (including hydrocortisone/cortisone 10) and some foods.  Daily application of broad spectrum spf 30+ sunscreen to face is recommended to reduce flares.  Patient denies grittiness of the eyes  Treatment Plan Continue Soolantra cream at bedtime 1 yr Rf. Counseling for BBL / IPL / Laser and Coordination of Care Discussed the treatment option of Broad Band Light (BBL) /Intense Pulsed Light (IPL)/ Laser for skin discoloration, including  brown spots and redness.  Typically we recommend at least 1-3 treatment sessions about 5-8 weeks apart for best results.  Cannot have tanned skin when BBL performed, and regular use of sunscreen/photoprotection  is advised after the procedure to help maintain results. The patient's condition may also require "maintenance treatments" in the future.  The fee for BBL / laser treatments is $350 per treatment session for the whole face.  A fee can be quoted for other parts of the body.  Insurance typically does not pay for BBL/laser treatments and therefore the fee is an out-of-pocket cost. Recommend prophylactic valtrex treatment. Once scheduled for procedure, will send Rx in prior to patient's appointment.   DERMATOFIBROMA Exam: 4.0 mm firm pink papule at top of right shoulder Treatment Plan: A dermatofibroma is a benign growth possibly related to trauma, such as an insect bite, cut from shaving, or inflamed acne-type bump.  Treatment options to remove include shave or excision with resulting scar and risk of recurrence.  Since benign-appearing and not bothersome, will observe for now.   NEOPLASM OF UNCERTAIN BEHAVIOR OF SKIN central upper abdomen Epidermal / dermal shaving  Lesion diameter (cm):  0.5 Informed consent: discussed and consent obtained   Patient was prepped and draped in usual sterile fashion: Area prepped with alcohol. Anesthesia: the lesion was anesthetized in a standard fashion   Anesthetic:  1% lidocaine w/ epinephrine 1-100,000 buffered w/ 8.4% NaHCO3 Instrument used: flexible razor blade   Hemostasis achieved with: pressure, aluminum chloride and electrodesiccation   Outcome: patient tolerated procedure well   Post-procedure details: wound care instructions given   Post-procedure details comment:  Ointment and small bandage applied Specimen 1 - Surgical pathology Differential Diagnosis: Irritated Hemangioma vs other Check Margins: No Discussed resulting small scar with shave removal, and possible recurrence of lesion.  Recommend vaseline ointment to area daily and cover until healed.  Recommend photoprotection/sunscreen to area to prevent discoloration of scar.  Once healed, may  apply OTC Serica scar gel bid to thickened scars.  ROSACEA   Related Medications Ivermectin (SOOLANTRA) 1 % CREA Apply daily to affected areas at face for rosacea. Return in about 1 year (around 10/26/2024) for TBSE.  ICherlyn Labella, CMA, am acting as scribe for Willeen Niece, MD .   Documentation: I have reviewed the above documentation for accuracy and completeness, and I agree with the above.  Willeen Niece, MD

## 2023-10-27 NOTE — Patient Instructions (Addendum)
 Rosacea  What is rosacea? Rosacea (say: ro-zay-sha) is a common skin disease that usually begins as a trend of flushing or blushing easily.  As rosacea progresses, a persistent redness in the center of the face will develop and may gradually spread beyond the nose and cheeks to the forehead and chin.  In some cases, the ears, chest, and back could be affected.  Rosacea may appear as tiny blood vessels or small red bumps that occur in crops.  Frequently they can contain pus, and are called "pustules".  If the bumps do not contain pus, they are referred to as "papules".  Rarely, in prolonged, untreated cases of rosacea, the oil glands of the nose and cheeks may become permanently enlarged.  This is called rhinophyma, and is seen more frequently in men.  Signs and Risks In its beginning stages, rosacea tends to come and go, which makes it difficult to recognize.  It can start as intermittent flushing of the face.  Eventually, blood vessels may become permanently visible.  Pustules and papules can appear, but can be mistaken for adult acne.  People of all races, ages, genders and ethnic groups are at risk of developing rosacea.  However, it is more common in women (especially around menopause) and adults with fair skin between the ages of 59 and 32.  Treatment Dermatologists typically recommend a combination of treatments to effectively manage rosacea.  Treatment can improve symptoms and may stop the progression of the rosacea.  Treatment may involve both topical and oral medications.  The tetracycline antibiotics are often used for their anti-inflammatory effect; however, because of the possibility of developing antibiotic resistance, they should not be used long term at full dose.  For dilated blood vessels the options include electrodessication (uses electric current through a small needle), laser treatment, and cosmetics to hide the redness.   With all forms of treatment, improvement is a slow process, and  patients may not see any results for the first 3-4 weeks.  It is very important to avoid the sun and other triggers.  Patients must wear sunscreen daily.  Skin Care Instructions: Cleanse the skin with a mild soap such as CeraVe cleanser, Cetaphil cleanser, or Dove soap once or twice daily as needed. Moisturize with Eucerin Redness Relief Daily Perfecting Lotion (has a subtle green tint), CeraVe Moisturizing Cream, or Oil of Olay Daily Moisturizer with sunscreen every morning and/or night as recommended. Makeup should be "non-comedogenic" (won't clog pores) and be labeled "for sensitive skin". Good choices for cosmetics are: Neutrogena, Almay, and Physician's Formula.  Any product with a green tint tends to offset a red complexion. If your eyes are dry and irritated, use artificial tears 2-3 times per day and cleanse the eyelids daily with baby shampoo.  Have your eyes examined at least every 2 years.  Be sure to tell your eye doctor that you have rosacea. Alcoholic beverages tend to cause flushing of the skin, and may make rosacea worse. Always wear sunscreen, protect your skin from extreme hot and cold temperatures, and avoid spicy foods, hot drinks, and mechanical irritation such as rubbing, scrubbing, or massaging the face.  Avoid harsh skin cleansers, cleansing masks, astringents, and exfoliation. If a particular product burns or makes your face feel tight, then it is likely to flare your rosacea. If you are having difficulty finding a sunscreen that you can tolerate, you may try switching to a chemical-free sunscreen.  These are ones whose active ingredient is zinc oxide or titanium dioxide  only.  They should also be fragrance free, non-comedogenic, and labeled for sensitive skin. Rosacea triggers may vary from person to person.  There are a variety of foods that have been reported to trigger rosacea.  Some patients find that keeping a diary of what they were doing when they flared helps them avoid  triggers.   Counseling for BBL / IPL / Laser and Coordination of Care Discussed the treatment option of Broad Band Light (BBL) /Intense Pulsed Light (IPL)/ Laser for skin discoloration, including brown spots and redness.  Typically we recommend at least 1-3 treatment sessions about 5-8 weeks apart for best results.  Cannot have tanned skin when BBL performed, and regular use of sunscreen/photoprotection is advised after the procedure to help maintain results. The patient's condition may also require "maintenance treatments" in the future.  The fee for BBL / laser treatments is $350 per treatment session for the whole face.  A fee can be quoted for other parts of the body.  Insurance typically does not pay for BBL/laser treatments and therefore the fee is an out-of-pocket cost. Recommend prophylactic valtrex treatment. Once scheduled for procedure, will send Rx in prior to patient's appointment.    Melanoma ABCDEs  Melanoma is the most dangerous type of skin cancer, and is the leading cause of death from skin disease.  You are more likely to develop melanoma if you: Have light-colored skin, light-colored eyes, or red or blond hair Spend a lot of time in the sun Tan regularly, either outdoors or in a tanning bed Have had blistering sunburns, especially during childhood Have a close family member who has had a melanoma Have atypical moles or large birthmarks  Early detection of melanoma is key since treatment is typically straightforward and cure rates are extremely high if we catch it early.   The first sign of melanoma is often a change in a mole or a new dark spot.  The ABCDE system is a way of remembering the signs of melanoma.  A for asymmetry:  The two halves do not match. B for border:  The edges of the growth are irregular. C for color:  A mixture of colors are present instead of an even brown color. D for diameter:  Melanomas are usually (but not always) greater than 6mm - the size of a  pencil eraser. E for evolution:  The spot keeps changing in size, shape, and color.  Please check your skin once per month between visits. You can use a small mirror in front and a large mirror behind you to keep an eye on the back side or your body.   If you see any new or changing lesions before your next follow-up, please call to schedule a visit.  Please continue daily skin protection including broad spectrum sunscreen SPF 30+ to sun-exposed areas, reapplying every 2 hours as needed when you're outdoors.   Staying in the shade or wearing long sleeves, sun glasses (UVA+UVB protection) and wide brim hats (4-inch brim around the entire circumference of the hat) are also recommended for sun protection.        Due to recent changes in healthcare laws, you may see results of your pathology and/or laboratory studies on MyChart before the doctors have had a chance to review them. We understand that in some cases there may be results that are confusing or concerning to you. Please understand that not all results are received at the same time and often the doctors may need to interpret  multiple results in order to provide you with the best plan of care or course of treatment. Therefore, we ask that you please give [place] 2 business days to thoroughly review all your results before contacting the office for clarification. Should we see a critical lab result, you will be contacted sooner.   If You Need Anything After Your Visit  If you have any questions or concerns for your doctor, please call our main line at [phone] and press option 4 to reach your doctor's medical assistant. If no one answers, please leave a voicemail as directed and we will return your call as soon as possible. Messages left after 4 pm will be answered the following business day.   You may also send [place] a message via MyChart. We typically respond to MyChart messages within 1-2 business days.  For prescription refills, please ask  your pharmacy to contact our office. Our fax number is [phone].  If you have an urgent issue when the clinic is closed that cannot wait until the next business day, you can page your doctor at the number below.    Please note that while we do our best to be available for urgent issues outside of office hours, we are not available 24/7.   If you have an urgent issue and are unable to reach [place], you may choose to seek medical care at your doctor's office, retail clinic, urgent care center, or emergency room.  If you have a medical emergency, please immediately call 911 or go to the emergency department.  Pager Numbers  - Dr. Gwen Pounds: [phone]  - Dr. Roseanne Reno: [phone]  - Dr. Katrinka Blazing: [phone]   In the event of inclement weather, please call our main line at [phone] for an update on the status of any delays or closures.  Dermatology Medication Tips: Please keep the boxes that topical medications come in in order to help keep track of the instructions about where and how to use these. Pharmacies typically print the medication instructions only on the boxes and not directly on the medication tubes.   If your medication is too expensive, please contact our office at [phone] option 4 or send [place] a message through MyChart.   We are unable to tell what your co-pay for medications will be in advance as this is different depending on your insurance coverage. However, we may be able to find a substitute medication at lower cost or fill out paperwork to get insurance to cover a needed medication.   If a prior authorization is required to get your medication covered by your insurance company, please allow [place] 1-2 business days to complete this process.  Drug prices often vary depending on where the prescription is filled and some pharmacies may offer cheaper prices.  The website www.goodrx.com contains coupons for medications through different pharmacies. The prices here do not  account for what the cost may be with help from insurance (it may be cheaper with your insurance), but the website can give you the price if you did not use any insurance.  - You can print the associated coupon and take it with your prescription to the pharmacy.  - You may also stop by our office during regular business hours and pick up a GoodRx coupon card.  - If you need your prescription sent electronically to a different pharmacy, notify our office through [facility] or by phone at [phone] option 4.     Si Usted Necesita Algo Despus de Su Visita  Tambin puede enviarnos un mensaje a travs de MyChart. Por lo general respondemos a los mensajes de MyChart en el transcurso de 1 a 2 das hbiles.  Para renovar recetas, por favor pida a su farmacia que se ponga en contacto con nuestra oficina. Annie Sable de fax es [place] [phone].  Si tiene un asunto urgente cuando la clnica est cerrada y que no puede esperar hasta el siguiente da hbil, puede llamar/localizar a su doctor(a) al nmero que aparece a continuacin.   Por favor, tenga en cuenta que aunque hacemos todo lo posible para estar disponibles para asuntos urgentes fuera del horario de [place], no estamos disponibles las 24 horas del da, los 7 [address]  [address] de la [place].   Si tiene un problema urgente y no puede comunicarse con nosotros, puede optar por buscar atencin mdica  en el consultorio de su doctor(a), en una clnica privada, en un centro de atencin urgente o en una sala de emergencias.  Si tiene Engineer, drilling, por favor llame inmediatamente al 911 o vaya a la sala de emergencias.  Nmeros de bper  - Dr. Gwen Pounds: [phone]  - Dra. Roseanne Reno: [phone]  - Dr. Katrinka Blazing: [phone]   En caso de inclemencias del tiempo, por favor llame a Lacy Duverney principal al [phone] para una actualizacin sobre el [place] de cualquier retraso o cierre.  Consejos para la medicacin en dermatologa: Por  favor, guarde las cajas en las que vienen los medicamentos de uso tpico para ayudarle a seguir las instrucciones sobre dnde y cmo usarlos. Las farmacias generalmente imprimen las instrucciones del medicamento slo en las cajas y no directamente en los tubos del [place].   Si su medicamento es muy caro, por favor, pngase en contacto con Rolm Gala llamando al [phone] y presione la opcin 4 o envenos un mensaje a travs de Clinical cytogeneticist.   No podemos decirle cul ser su copago por los medicamentos por adelantado ya que esto es diferente dependiendo de la cobertura de su seguro. Sin embargo, es posible que podamos encontrar un medicamento sustituto a Audiological scientist un formulario para que el seguro cubra el medicamento que se considera necesario.   Si se requiere una autorizacin previa para que su compaa de seguros [place] su medicamento, por favor permtanos de 1 a 2 das hbiles para completar [address]  Los precios de los medicamentos varan con frecuencia dependiendo del Environmental consultant de dnde se surte la receta y alguna farmacias pueden ofrecer precios ms baratos.  El sitio web www.goodrx.com tiene cupones para medicamentos de Health and safety inspector. Los precios aqu no tienen en cuenta lo que podra costar con la ayuda del seguro (puede ser ms barato con su seguro), pero el sitio web puede darle el precio si no utiliz Tourist information centre manager.  - Puede imprimir el cupn correspondiente y llevarlo con su receta a la farmacia.  - Tambin puede pasar por nuestra oficina durante el horario de atencin regular y Education officer, museum una tarjeta de cupones de GoodRx.  - Si necesita que su receta se enve electrnicamente a una farmacia diferente, informe a nuestra oficina a travs de MyChart de [place] o por telfono llamando al [phone] y presione la opcin 4.

## 2023-10-28 LAB — SURGICAL PATHOLOGY

## 2023-11-01 ENCOUNTER — Telehealth: Payer: Self-pay

## 2023-11-01 NOTE — Telephone Encounter (Signed)
Patient informed of pathology results

## 2023-11-01 NOTE — Telephone Encounter (Signed)
-----   Message from Willeen Niece sent at 11/01/2023 11:26 AM EST ----- 1. Skin, central upper abdomen :       HEMANGIOMA, IRRITATED   benign - please call patient

## 2023-11-01 NOTE — Telephone Encounter (Signed)
Lft pt msg to call for bx result/sh

## 2023-11-03 ENCOUNTER — Telehealth: Payer: Managed Care, Other (non HMO) | Admitting: Physician Assistant

## 2023-11-03 DIAGNOSIS — R6889 Other general symptoms and signs: Secondary | ICD-10-CM | POA: Diagnosis not present

## 2023-11-04 MED ORDER — BENZONATATE 100 MG PO CAPS
100.0000 mg | ORAL_CAPSULE | Freq: Three times a day (TID) | ORAL | 0 refills | Status: DC | PRN
Start: 2023-11-04 — End: 2023-12-20

## 2023-11-04 NOTE — Progress Notes (Signed)
I have spent 5 minutes in review of e-visit questionnaire, review and updating patient chart, medical decision making and response to patient.   Piedad Climes, PA-C

## 2023-11-04 NOTE — Progress Notes (Signed)
E visit for Flu like symptoms   We are sorry that you are not feeling well.  Here is how we plan to help! Based on what you have shared with me it looks like you may have a respiratory virus that may be influenza.  Influenza or "the flu" is   an infection caused by a respiratory virus. The flu virus is highly contagious and persons who did not receive their yearly flu vaccination may "catch" the flu from close contact.  We have anti-viral medications to treat the viruses that cause this infection. They are not a "cure" and only shorten the course of the infection. These prescriptions are most effective when they are given within the first 2 days of "flu" symptoms. Antiviral medication are indicated if you have a high risk of complications from the flu. You should  also consider an antiviral medication if you are in close contact with someone who is at risk. These medications can help patients avoid complications from the flu  but have side effects that you should know. Possible side effects from Tamiflu or oseltamivir include nausea, vomiting, diarrhea, dizziness, headaches, eye redness, sleep problems or other respiratory symptoms. You should not take Tamiflu if you have an allergy to oseltamivir or any to the ingredients in Tamiflu.  Based upon your symptoms and potential risk factors I recommend that you follow the flu symptoms recommendation that I have listed below.  Please keep well-hydrated and try to get plenty of rest. If you have a humidifier, place it in the bedroom and run it at night. Start a saline nasal rinse for nasal congestion. You can consider use of a nasal steroid spray like Flonase or Nasacort OTC. You can alternate between Tylenol and Ibuprofen if needed for fever, body aches, headache and/or throat pain. Salt water-gargles and chloraseptic spray can be very beneficial for sore throat. Mucinex-DM for congestion or cough. I have sent in a prescription cough medication that you  can take along with OTC medicines. Please take all prescribed medications as directed.  Remain out of work until [employer] for 24 hours without a fever-reducing medication, and you are feeling better.  You should mask until symptoms are resolved.  If anything worsens despite treatment, you need to be evaluated in-person. Please do not delay care.   ANYONE WHO HAS FLU SYMPTOMS SHOULD: Stay home. The flu is highly contagious and going out or to work exposes others! Be sure to drink plenty of fluids. Water is fine as well as fruit juices, sodas and electrolyte beverages. You may want to stay away from caffeine or alcohol. If you are nauseated, try taking small sips of liquids. How do you know if you are getting enough fluid? Your urine should be a pale yellow or almost colorless. Get rest. Taking a steamy shower or using a humidifier may help nasal congestion and ease sore throat pain. Using a saline nasal spray works much the same way. Cough drops, hard candies and sore throat lozenges may ease your cough. Line up a caregiver. Have someone check on you regularly.   GET HELP RIGHT AWAY IF: You cannot keep down liquids or your medications. You become short of breath Your fell like you are going to pass out or loose consciousness. Your symptoms persist after you have completed your treatment plan MAKE SURE YOU  Understand these instructions. Will watch your condition. Will get help right away if you are not doing well or get worse.  Your e-visit answers were reviewed  by a board certified advanced clinical practitioner to complete your personal care plan.  Depending on the condition, your plan could have included both over the counter or prescription medications.  If there is a problem please reply  once you have received a response from your provider.  Your safety is important to [place].  If you have drug allergies check your prescription carefully.    You can use MyChart to ask questions  about today's visit, request a non-urgent call back, or ask for a work or school excuse for 24 hours related to this e-Visit. If it has been greater than 24 hours you will need to follow up with your provider, or enter a new e-Visit to address those concerns.  You will get an e-mail in the next two days asking about your experience.  I hope that your e-visit has been valuable and will speed your recovery. Thank you for using e-visits.

## 2023-12-20 ENCOUNTER — Ambulatory Visit (INDEPENDENT_AMBULATORY_CARE_PROVIDER_SITE_OTHER): Admitting: Family Medicine

## 2023-12-20 VITALS — BP 126/70 | HR 95 | Resp 16 | Ht 66.0 in | Wt 209.7 lb

## 2023-12-20 DIAGNOSIS — E559 Vitamin D deficiency, unspecified: Secondary | ICD-10-CM

## 2023-12-20 DIAGNOSIS — E66811 Obesity, class 1: Secondary | ICD-10-CM

## 2023-12-20 MED ORDER — VITAMIN D (ERGOCALCIFEROL) 1.25 MG (50000 UNIT) PO CAPS
50000.0000 [IU] | ORAL_CAPSULE | ORAL | 1 refills | Status: AC
Start: 2023-12-20 — End: ?

## 2023-12-20 NOTE — Progress Notes (Signed)
      Established patient visit   Patient: Briana Cummings   DOB: 18-May-1981   43 y.o. Female  MRN: [id number] Visit Date: 12/20/2023  Today's healthcare provider: Mila Merry, MD   Chief Complaint  Patient presents with   Medical Management of Chronic Issues    Need biometric form filled out   Subjective    HPI Presents for follow up low vitamin, lipids, and needs biometric completed for insurance at her work. Feels well. Has ran out of prescription vitamin d so taking 1 or 2 OTC vitamin D every week. Has trouble remembering to take pill every day. Is working on exercising more with nicer weather. Trying to follow  healthy diet by constrained by time.   Lab Results  Component Value Date   VD25OH 25.9 (L) 02/09/2023   Lab Results  Component Value Date   CHOL 213 (H) 02/09/2023   HDL 50 02/09/2023   LDLCALC 143 (H) 02/09/2023   TRIG 110 02/09/2023   CHOLHDL 3.7 01/27/2022     Medications: Outpatient Medications Prior to Visit  Medication Sig   Ivermectin (SOOLANTRA) 1 % CREA Apply daily to affected areas at face for rosacea.   ketoconazole (NIZORAL) 2 % cream Apply qd/bid prn for rash at L breast   Vitamin D, Ergocalciferol, (DRISDOL) 1.25 MG (50000 UNIT) CAPS capsule Take 1 capsule (50,000 Units total) by mouth every 7 (seven) days.   celecoxib (CELEBREX) 200 MG capsule Take 1 capsule (200 mg total) by mouth 2 (two) times daily.   doxycycline (MONODOX) 100 MG capsule Take 1 capsule (100 mg total) by mouth daily. Take with food   benzonatate (TESSALON) 100 MG capsule Take 1 capsule (100 mg total) by mouth 3 (three) times daily as needed for cough.   No facility-administered medications prior to visit.     Objective    BP 126/70 (BP Location: Left Arm, Patient Position: Sitting, Cuff Size: Large)   Pulse 95   Resp 16   Ht 5\' 6"  (1.676 m)   Wt 209 lb 11.2 oz (95.1 kg)   LMP 12/09/2023   SpO2 100%   BMI 33.85 kg/m    Physical Exam   General appearance: Mildly  obese female, cooperative and in no acute distress Head: Normocephalic, without obvious abnormality, atraumatic Respiratory: Respirations even and unlabored, normal respiratory rate Extremities: All extremities are intact.  Skin: Skin color, texture, turgor normal. No rashes seen  Psych: Appropriate mood and affect. Neurologic: Mental status: Alert, oriented to person, place, and time, thought content appropriate.    Assessment & Plan     1. Avitaminosis D Out of prescription vitamin d for a few months and prefers to just have to take once a week.  - Vitamin D, Ergocalciferol, (DRISDOL) 1.25 MG (50000 UNIT) CAPS capsule; Take 1 capsule (50,000 Units total) by mouth every 7 (seven) days.  Dispense: 12 capsule; Refill: 1    2. Obesity (BMI 30.0-34.9) Working on exercising more and improving diet. Completed biometric forms for work.   Previously seen Merita Norton,   Is to return to establish and yearly exam with new PCP in May.         Mila Merry, MD  [facility] [phone] (phone) [phone] (fax)  [facility]

## 2023-12-20 NOTE — Patient Instructions (Signed)
 Marland Kitchen  Please review the attached list of medications and notify my office if there are any errors.   . Please bring all of your medications to every appointment so we can make sure that our medication list is the same as yours.

## 2024-02-01 ENCOUNTER — Ambulatory Visit: Payer: Managed Care, Other (non HMO) | Admitting: Dermatology

## 2024-02-14 ENCOUNTER — Ambulatory Visit: Admitting: Family Medicine

## 2024-02-14 ENCOUNTER — Encounter: Payer: Self-pay | Admitting: Family Medicine

## 2024-02-14 VITALS — BP 118/77 | HR 76 | Temp 98.3°F | Ht 66.0 in | Wt 214.4 lb

## 2024-02-14 DIAGNOSIS — E66811 Obesity, class 1: Secondary | ICD-10-CM

## 2024-02-14 DIAGNOSIS — M25552 Pain in left hip: Secondary | ICD-10-CM

## 2024-02-14 DIAGNOSIS — L719 Rosacea, unspecified: Secondary | ICD-10-CM

## 2024-02-14 DIAGNOSIS — K219 Gastro-esophageal reflux disease without esophagitis: Secondary | ICD-10-CM

## 2024-02-14 DIAGNOSIS — Z0001 Encounter for general adult medical examination with abnormal findings: Secondary | ICD-10-CM

## 2024-02-14 DIAGNOSIS — E789 Disorder of lipoprotein metabolism, unspecified: Secondary | ICD-10-CM

## 2024-02-14 DIAGNOSIS — Z713 Dietary counseling and surveillance: Secondary | ICD-10-CM

## 2024-02-14 DIAGNOSIS — R5382 Chronic fatigue, unspecified: Secondary | ICD-10-CM

## 2024-02-14 DIAGNOSIS — Z0000 Encounter for general adult medical examination without abnormal findings: Secondary | ICD-10-CM

## 2024-02-14 DIAGNOSIS — E559 Vitamin D deficiency, unspecified: Secondary | ICD-10-CM

## 2024-02-14 DIAGNOSIS — G8929 Other chronic pain: Secondary | ICD-10-CM

## 2024-02-14 DIAGNOSIS — R0683 Snoring: Secondary | ICD-10-CM

## 2024-02-14 DIAGNOSIS — R519 Headache, unspecified: Secondary | ICD-10-CM

## 2024-02-14 MED ORDER — OMEPRAZOLE 20 MG PO CPDR: 20.0000 mg | DELAYED_RELEASE_CAPSULE | Freq: Every day | ORAL | 0 refills | Status: AC

## 2024-02-14 NOTE — Progress Notes (Signed)
 Complete physical exam   Patient: Briana Cummings   DOB: Apr 25, 1981   43 y.o. Female  MRN: [id number] Visit Date: 02/14/2024  Today's healthcare provider: Carlean Charter, DO   Chief Complaint  Patient presents with   Annual Exam    Last completed 02/09/23 Diet -  General, unhealthy but will be starting a new diet (low calorie and eating more veggies) Exercise - yard work  two to three times a week for about an hour Feeling - well outside of hip pain and acid reflux Sleeping - fairly well Concerns -  Hip Pain   Hip Pain    Left hip pain X last July previously mentioned to [place]. Was advised possible pinched nerve. Reports she can not stand longer than 20 minutes without it causing pain. Taking ibuprofen from otc   Subjective    Briana Cummings is a 43 y.o. female who presents today for a complete physical exam.   Hip Pain  Pertinent negatives include no numbness.   HPI     Annual Exam    Additional comments: Last completed 02/09/23 Diet -  General, unhealthy but will be starting a new diet (low calorie and eating more veggies) Exercise - yard work  two to three times a week for about an hour Feeling - well outside of hip pain and acid reflux Sleeping - fairly well Concerns -  Hip Pain        Hip Pain    Additional comments: Left hip pain X last July previously mentioned to [place]. Was advised possible pinched nerve. Reports she can not stand longer than 20 minutes without it causing pain. Taking ibuprofen from otc      Last edited by Pasty Bongo, CMA on 02/14/2024  2:05 PM.       Briana Cummings is a 43 year old female who presents with hip pain.  She has been experiencing hip pain since April 09, 2023, which began suddenly while walking around her house. The pain radiates down her entire leg and was initially severe, causing her leg to give out when sneezing. Over the first two weeks, the pain was intense, but it has since become constant and less severe. She  cannot stand for more than 15-20 minutes without experiencing pain. Various treatments, including ibuprofen, ice, and online stretches, have provided limited relief, though cold application helps alleviate severe pain. She was previously prescribed methocarbamol , which caused dizziness, and celecoxib , which she took only a few times. She prefers not to take medication unless necessary and has a sedentary job, influencing her decision to use pain relief sparingly. Her hip pain affects her daily activities, such as attending events with her mother, as she makes decisions based on anticipated hip discomfort. She has not undergone physical therapy.  She experiences chronic acid reflux, described as a burning sensation 'like lava,' sometimes requiring her to sleep sitting up. She takes over-the-counter Pepcid daily and has a history of long-term use. Reflux occurs even with bland foods. She has stopped drinking soda and avoids spicy foods due to reflux. She also experiences postnasal drainage and occasional dizziness.  She experiences shortness of breath, which began about a year ago, and attributes it to being overweight. She also reports fatigue, feeling tired by 8 PM, and occasionally needing naps despite not being a habitual napper. She experiences anxiety primarily related to her job, which involves dealing with angry people. She has been prescribed medication for anxiety but has not taken it due  to concerns about dependency.  She takes doxycycline  for rosacea and is supposed to take high-dose vitamin D  weekly but often forgets. She has a family history of similar health issues, as her mother also had low vitamin D  levels. She reports regular menstrual cycles, though they have become less predictable and shorter in duration, using super tampons for the first two days, then regular ones.     Past Medical History:  Diagnosis Date   dysplastic nevus 10/23/2015   left anterior side   Family history of  colon cancer    mom age 76; colonoscopy due age 75   GERD (gastroesophageal reflux disease)    HPV in female    Kidney stones    Rosacea    Past Surgical History:  Procedure Laterality Date   BREAST BIOPSY Right 11-21-14   PSEUDOANGIOMATOUS STROMAL HYPERPLASIA   BREAST LUMPECTOMY Right 2017   COLONOSCOPY  2011   because of family history   COLONOSCOPY WITH PROPOFOL  N/A 02/04/2021   Procedure: COLONOSCOPY WITH PROPOFOL ;  Surgeon: Luke Salaam, MD;  [facility] END[facility]COPY;  Service: Gastroenterology;  Laterality: N/A;   LITHOTRIPSY     WISDOM TOOTH EXTRACTION     Social History   Socioeconomic History   Marital status: Married    Spouse name: Not on file   Number of children: Not on file   Years of education: Not on file   Highest education level: Master's degree (e.g., MA, MS, MEng, MEd, MSW, MBA)  Occupational History   Not on file  Tobacco Use   Smoking status: Never   Smokeless tobacco: Never  Vaping Use   Vaping status: Never Used  Substance and Sexual Activity   Alcohol use: Yes    Alcohol/week: 0.0 standard drinks of alcohol    Comment: occasionally   Drug use: No   Sexual activity: Yes    Birth control/protection: None  Other Topics Concern   Not on file  Social History Narrative   Not on file   Social Drivers of Health   Financial Resource Strain: Patient Declined (12/20/2023)   Overall Financial Resource Strain (CARDIA)    Difficulty of Paying Living Expenses: Patient declined  Food Insecurity: No Food Insecurity (12/20/2023)   Hunger Vital Sign    Worried About Running Out of Food in the Last Year: Never true    Ran Out of Food in the Last Year: Never true  Transportation Needs: No Transportation Needs (12/20/2023)   PRAPARE - Administrator, Civil Service (Medical): No    Lack of Transportation (Non-Medical): No  Physical Activity: Unknown (12/20/2023)   Exercise Vital Sign    Days of Exercise per Week: 0 days    Minutes of Exercise per  Session: Not on file  Stress: Patient Declined (12/20/2023)   [employer] of Occupational Health - Occupational Stress Questionnaire    Feeling of Stress : Patient declined  Social Connections: Unknown (12/20/2023)   Social Connection and Isolation Panel [NHANES]    Frequency of Communication with Friends and Family: More than three times a week    Frequency of Social Gatherings with Friends and Family: Patient declined    Attends Religious Services: Patient declined    Database administrator or Organizations: No    Attends Engineer, structural: Not on file    Marital Status: Married  Catering manager Violence: Not on file   Family Status  Relation Name Status   Mother  Alive   Father  Alive  PGM  Deceased   PGF  Deceased   Pat Aunt  Alive   Mat Uncle  Deceased  No partnership data on file   Family History  Problem Relation Age of Onset   Colon cancer Mother 28       genetic negative   Cancer Paternal Grandmother        lung primary, extensive mets   Cancer Paternal Grandfather        lung   Heart attack Paternal Grandfather    Breast cancer Paternal Aunt 81   Prostate cancer Maternal Uncle    Allergies  Allergen Reactions   Peanut Oil Itching   Amoxicillin Rash   Penicillins Rash    Patient Care Team: Aseel Uhde, Asencion Blacksmith, DO as PCP - General (Family Medicine) Ward, Margarie Shay, MD as Referring Physician (Obstetrics and Gynecology) Marshall Skeeter, MD (General Surgery) Tasia Farr, FNP (Family Medicine)   Medications: Outpatient Medications Prior to Visit  Medication Sig   doxycycline  (MONODOX ) 100 MG capsule Take 1 capsule (100 mg total) by mouth daily. Take with food   famotidine (PEPCID) 20 MG tablet Take 20 mg by mouth daily as needed for heartburn or indigestion.   Ivermectin  (SOOLANTRA ) 1 % CREA Apply daily to affected areas at face for rosacea.   ketoconazole  (NIZORAL ) 2 % cream Apply qd/bid prn for rash at L breast   Vitamin D ,  Ergocalciferol , (DRISDOL ) 1.25 MG (50000 UNIT) CAPS capsule Take 1 capsule (50,000 Units total) by mouth every 7 (seven) days.   [DISCONTINUED] celecoxib  (CELEBREX ) 200 MG capsule Take 1 capsule (200 mg total) by mouth 2 (two) times daily. (Patient not taking: Reported on 02/14/2024)   No facility-administered medications prior to visit.    Review of Systems  Constitutional:  Negative for chills, fatigue and fever.  HENT:  Negative for congestion, ear pain, rhinorrhea, sneezing and sore throat.   Eyes: Negative.  Negative for pain and redness.  Respiratory:  Positive for shortness of breath (first started noticing one year ago). Negative for cough and wheezing.   Cardiovascular:  Negative for chest pain and leg swelling.  Gastrointestinal:  Negative for abdominal pain, blood in stool, constipation, diarrhea and nausea.  Endocrine: Negative for polydipsia and polyphagia.  Genitourinary: Negative.  Negative for dysuria, flank pain, hematuria, pelvic pain, vaginal bleeding and vaginal discharge.  Musculoskeletal:  Negative for arthralgias, back pain, gait problem and joint swelling.  Skin:  Negative for rash.  Neurological: Negative.  Negative for dizziness, tremors, seizures, weakness, light-headedness, numbness and headaches.  Hematological:  Negative for adenopathy.  Psychiatric/Behavioral: Negative.  Negative for behavioral problems, confusion and dysphoric mood. The patient is not nervous/anxious and is not hyperactive.        Objective    BP 118/77 (BP Location: Left Arm, Patient Position: Sitting, Cuff Size: Normal)   Pulse 76   Temp 98.3 F (36.8 C) (Oral)   Ht 5\' 6"  (1.676 m)   Wt 214 lb 6.4 oz (97.3 kg)   LMP 01/31/2024   SpO2 100%   BMI 34.61 kg/m    Physical Exam Vitals and nursing note reviewed.  Constitutional:      General: She is awake.     Appearance: Normal appearance.  HENT:     Head: Normocephalic and atraumatic.     Right Ear: Tympanic membrane, ear canal  and external ear normal.     Left Ear: Tympanic membrane, ear canal and external ear normal.     Nose: Nose normal.  Mouth/Throat:     Mouth: Mucous membranes are moist.     Pharynx: Oropharynx is clear. No oropharyngeal exudate or posterior oropharyngeal erythema.  Eyes:     General: No scleral icterus.    Extraocular Movements: Extraocular movements intact.     Conjunctiva/sclera: Conjunctivae normal.     Pupils: Pupils are equal, round, and reactive to light.  Neck:     Thyroid: No thyromegaly or thyroid tenderness.  Cardiovascular:     Rate and Rhythm: Normal rate and regular rhythm.     Pulses: Normal pulses.     Heart sounds: Normal heart sounds.  Pulmonary:     Effort: Pulmonary effort is normal. No tachypnea, bradypnea or respiratory distress.     Breath sounds: Normal breath sounds. No stridor. No wheezing, rhonchi or rales.  Abdominal:     General: Bowel sounds are normal. There is no distension.     Palpations: Abdomen is soft. There is no mass.     Tenderness: There is no abdominal tenderness. There is no guarding.     Hernia: No hernia is present.  Musculoskeletal:     Cervical back: Normal range of motion and neck supple.     Right lower leg: No edema.     Left lower leg: No edema.  Lymphadenopathy:     Cervical: No cervical adenopathy.  Skin:    General: Skin is warm and dry.  Neurological:     Mental Status: She is alert and oriented to person, place, and time. Mental status is at baseline.  Psychiatric:        Mood and Affect: Mood normal.        Behavior: Behavior normal.      The 10-year ASCVD risk score (Arnett DK, et al., 2019) is: 0.8%   Values used to calculate the score:     Age: 89 years     Sex: Female     Is Non-Hispanic African American: No     Diabetic: No     Tobacco smoker: No     Systolic Blood Pressure: 118 mmHg     Is BP treated: No     HDL Cholesterol: 53 mg/dL     Total Cholesterol: 213 mg/dL   Last depression screening  scores    02/14/2024    2:32 PM 12/20/2023    1:04 PM 02/09/2023   10:53 AM  PHQ 2/9 Scores  PHQ - 2 Score 0 0 0  PHQ- 9 Score   5   Last fall risk screening    02/14/2024    2:31 PM  Fall Risk   Falls in the past year? 0  Number falls in past yr: 0  Injury with Fall? 0  Risk for fall due to : No Fall Risks   Last Audit-C alcohol use screening    02/14/2024    2:33 PM  Alcohol Use Disorder Test (AUDIT)  1. How often do you have a drink containing alcohol? 1  2. How many drinks containing alcohol do you have on a typical day when you are drinking? 0  3. How often do you have six or more drinks on one occasion? 0  AUDIT-C Score 1   A score of 3 or more in women, and 4 or more in men indicates increased risk for alcohol abuse, EXCEPT if all of the points are from question 1   Results for orders placed or performed in visit on 02/14/24  Comprehensive metabolic panel with GFR  Result  Value Ref Range   Glucose 90 70 - 99 mg/dL   BUN 7 6 - 24 mg/dL   Creatinine, Ser 2.95 0.57 - 1.00 mg/dL   eGFR 80 >62 ZH/YQM/5.78   BUN/Creatinine Ratio 8 (L) 9 - 23   Sodium 138 134 - 144 mmol/L   Potassium 4.5 3.5 - 5.2 mmol/L   Chloride 102 96 - 106 mmol/L   CO2 20 20 - 29 mmol/L   Calcium 9.1 8.7 - 10.2 mg/dL   Total Protein 6.7 6.0 - 8.5 g/dL   Albumin 4.5 3.9 - 4.9 g/dL   Globulin, Total 2.2 1.5 - 4.5 g/dL   Bilirubin Total 0.9 0.0 - 1.2 mg/dL   Alkaline Phosphatase 76 44 - 121 IU/L   AST 17 0 - 40 IU/L   ALT 9 0 - 32 IU/L  Lipid Panel With LDL/HDL Ratio  Result Value Ref Range   Cholesterol, Total 213 (H) 100 - 199 mg/dL   Triglycerides 469 0 - 149 mg/dL   HDL 53 >62 mg/dL   VLDL Cholesterol Cal 18 5 - 40 mg/dL   LDL Chol Calc (NIH) 952 (H) 0 - 99 mg/dL   LDL/HDL Ratio 2.7 0.0 - 3.2 ratio  VITAMIN D  25 Hydroxy (Vit-D Deficiency, Fractures)  Result Value Ref Range   Vit D, 25-Hydroxy 27.5 (L) 30.0 - 100.0 ng/mL  Vitamin B12  Result Value Ref Range   Vitamin B-12 330 232 -  1,245 pg/mL    Assessment & Plan    Routine Health Maintenance and Physical Exam  Exercise Activities and Dietary recommendations  Goals   None     Immunization History  Administered Date(s) Administered   Janssen (J&J) SARS-COV-2 Vaccination 01/11/2020   Tdap 01/27/2022    Health Maintenance  Topic Date Due   COVID-19 Vaccine (2 - Janssen risk series) 07/05/2024 (Originally 02/08/2020)   INFLUENZA VACCINE  05/05/2024   Cervical Cancer Screening (HPV/Pap Cotest)  12/16/2025   Colonoscopy  02/04/2026   DTaP/Tdap/Td (2 - Td or Tdap) 01/28/2032   Hepatitis C Screening  Completed   HIV Screening  Completed   HPV VACCINES  Aged Out   Meningococcal B Vaccine  Aged Out    Discussed health benefits of physical activity, and encouraged her to engage in regular exercise appropriate for her age and condition.   Annual physical exam -     Comprehensive metabolic panel with GFR -     Lipid Panel With LDL/HDL Ratio -     VITAMIN D  25 Hydroxy (Vit-D Deficiency, Fractures) -     Vitamin B12  Chronic left hip pain  Gastroesophageal reflux disease, unspecified whether esophagitis present -     Omeprazole ; Take 1 capsule (20 mg total) by mouth daily before supper.  Dispense: 30 capsule; Refill: 0  Obesity (BMI 30.0-34.9) -     Comprehensive metabolic panel with GFR  Weight loss counseling, encounter for  Chronic fatigue -     Home sleep test  Morning headache -     Home sleep test  Loud snoring -     Home sleep test  Abnormal cholesterol test -     Lipid Panel With LDL/HDL Ratio  Rosacea  Vitamin D  deficiency -     VITAMIN D  25 Hydroxy (Vit-D Deficiency, Fractures)     Annual physical exam Physical exam overall unremarkable except as noted above. Routine lab work ordered as noted.    Chronic left hip pain Chronic hip pain since July, likely piriformis syndrome  or greater trochanteric bursitis. Previous methocarbamol  caused dizziness. Ibuprofen and ice provide  partial relief. Discussed NSAIDs and exercises. Consider orthopedic referral for injection if bursitis confirmed. - Recommend naproxen 220 mg, two tablets twice daily for five days. - Encourage hip strengthening exercises.  Gave patient handout with examples and went over it with her. - Consider orthopedic referral for injection if symptoms persist.  Gastroesophageal reflux disease (GERD) Chronic GERD with acid reflux, especially when supine. Currently on Pepcid. Discussed lifestyle modifications and trial of omeprazole  for symptom management. - Prescribe omeprazole , take daily before supper for one month. - Encourage dietary modifications to avoid trigger foods. - Encourage weight loss.  Obesity; weight loss counseling Obesity contributing to GERD and dyspnea. Discussed weight impact on health and encouraged weight loss through diet and exercise. - Encourage weight loss through diet and exercise.  Chronic fatigue; morning headache; loud snoring Suspected sleep apnea due to fatigue, snoring, and morning headaches. Discussed health impact and recommended home sleep study.  Vitamin B12 levels to be checked due to fatigue. - Order home sleep study. - Check vitamin B12 levels.   Anxiety Anxiety related to occupational stress. Previous medication not taken due to dependency concerns. Discussed non-pharmacological management. - Encourage stress management techniques.  Rosacea Rosacea managed with doxycycline  and ivermectin  cream.  Follows with dermatology; defer to specialist management.  Vitamin D  deficiency Vitamin D  deficiency with inconsistent supplementation. Discussed regular supplementation and potential role of vitamin K.  - Check vitamin D  levels. - Encourage weekly vitamin D  supplementation.    Return in about 1 year (around 02/13/2025) for CPE.     I discussed the assessment and treatment plan with the patient  The patient was provided an opportunity to ask questions and all  were answered. The patient agreed with the plan and demonstrated an understanding of the instructions.   The patient was advised to call back or seek an in-person evaluation if the symptoms worsen or if the condition fails to improve as anticipated.    Carlean Charter, DO  [facility] [phone] (phone) [phone] (fax)  [facility]

## 2024-02-14 NOTE — Patient Instructions (Signed)
 Using over-the-counter Aleve (naproxen or Naprosyn) 220 mg tablets  -Take 2 tablets twice daily for 5 days

## 2024-02-15 LAB — LIPID PANEL WITH LDL/HDL RATIO
Cholesterol, Total: 213 mg/dL — ABNORMAL HIGH (ref 100–199)
HDL: 53 mg/dL (ref >39)
LDL Chol Calc (NIH): 142 mg/dL — ABNORMAL HIGH (ref 0–99)
LDL/HDL Ratio: 2.7 ratio (ref 0.0–3.2)
Triglycerides: 102 mg/dL (ref 0–149)
VLDL Cholesterol Cal: 18 mg/dL (ref 5–40)

## 2024-02-15 LAB — COMPREHENSIVE METABOLIC PANEL WITH GFR
ALT: 9 IU/L (ref 0–32)
AST: 17 IU/L (ref 0–40)
Albumin: 4.5 g/dL (ref 3.9–4.9)
Alkaline Phosphatase: 76 IU/L (ref 44–121)
BUN/Creatinine Ratio: 8 — ABNORMAL LOW (ref 9–23)
BUN: 7 mg/dL (ref 6–24)
Bilirubin Total: 0.9 mg/dL (ref 0.0–1.2)
CO2: 20 mmol/L (ref 20–29)
Calcium: 9.1 mg/dL (ref 8.7–10.2)
Chloride: 102 mmol/L (ref 96–106)
Creatinine, Ser: 0.91 mg/dL (ref 0.57–1.00)
Globulin, Total: 2.2 g/dL (ref 1.5–4.5)
Glucose: 90 mg/dL (ref 70–99)
Potassium: 4.5 mmol/L (ref 3.5–5.2)
Sodium: 138 mmol/L (ref 134–144)
Total Protein: 6.7 g/dL (ref 6.0–8.5)
eGFR: 80 mL/min/{1.73_m2} (ref >59)

## 2024-02-15 LAB — VITAMIN B12: Vitamin B-12: 330 pg/mL (ref 232–1245)

## 2024-02-15 LAB — VITAMIN D 25 HYDROXY (VIT D DEFICIENCY, FRACTURES): Vit D, 25-Hydroxy: 27.5 ng/mL — ABNORMAL LOW (ref 30.0–100.0)

## 2024-02-20 ENCOUNTER — Ambulatory Visit: Payer: Self-pay | Admitting: Family Medicine

## 2024-02-20 DIAGNOSIS — L719 Rosacea, unspecified: Secondary | ICD-10-CM | POA: Insufficient documentation

## 2024-02-20 DIAGNOSIS — G8929 Other chronic pain: Secondary | ICD-10-CM | POA: Insufficient documentation

## 2024-03-12 ENCOUNTER — Other Ambulatory Visit: Payer: Self-pay | Admitting: Family Medicine

## 2024-03-12 DIAGNOSIS — K219 Gastro-esophageal reflux disease without esophagitis: Secondary | ICD-10-CM

## 2024-03-27 ENCOUNTER — Encounter: Payer: Self-pay | Admitting: Family Medicine

## 2024-03-27 DIAGNOSIS — G8929 Other chronic pain: Secondary | ICD-10-CM

## 2024-03-27 DIAGNOSIS — R32 Unspecified urinary incontinence: Secondary | ICD-10-CM

## 2024-03-27 DIAGNOSIS — R202 Paresthesia of skin: Secondary | ICD-10-CM

## 2024-07-10 ENCOUNTER — Ambulatory Visit

## 2024-07-10 DIAGNOSIS — D229 Melanocytic nevi, unspecified: Secondary | ICD-10-CM

## 2024-07-10 DIAGNOSIS — Z1283 Encounter for screening for malignant neoplasm of skin: Secondary | ICD-10-CM

## 2024-07-10 DIAGNOSIS — D492 Neoplasm of unspecified behavior of bone, soft tissue, and skin: Secondary | ICD-10-CM

## 2024-07-10 DIAGNOSIS — Z86018 Personal history of other benign neoplasm: Secondary | ICD-10-CM

## 2024-07-10 DIAGNOSIS — L720 Epidermal cyst: Secondary | ICD-10-CM | POA: Diagnosis not present

## 2024-07-10 DIAGNOSIS — L578 Other skin changes due to chronic exposure to nonionizing radiation: Secondary | ICD-10-CM

## 2024-07-10 DIAGNOSIS — L814 Other melanin hyperpigmentation: Secondary | ICD-10-CM | POA: Diagnosis not present

## 2024-07-10 DIAGNOSIS — D1801 Hemangioma of skin and subcutaneous tissue: Secondary | ICD-10-CM | POA: Diagnosis not present

## 2024-07-10 DIAGNOSIS — W908XXA Exposure to other nonionizing radiation, initial encounter: Secondary | ICD-10-CM

## 2024-07-10 NOTE — Progress Notes (Signed)
 Subjective   Briana Cummings is a 42 y.o. female who presents for the following: Lesion(s) of concern . Patient is established patient   Today patient reports: Lesion of concern at right shoulder present, reports at last appointment in January did have a small bump present and scaly patch, reports has grown since then and also changed in color. Recent MM cut out in father. Hx of dysplastic nevus.   Review of Systems:    No other skin or systemic complaints except as noted in HPI or Assessment and Plan.  The following portions of the chart were reviewed this encounter and updated as appropriate: medications, allergies, medical history  Relevant Medical History:  n/a   Objective  Well appearing patient in no apparent distress; mood and affect are within normal limits. Examination was performed of the: Focused Exam of: Right shoulder   Examination notable for: Angioma(s): Scattered red vascular papule(s)  , Lentigo/lentigines: Scattered pigmented macules that are tan to brown in color and are somewhat non-uniform in shape and concentrated in the sun-exposed areas  Nevi - well demarcated brown macules  Examination limited by: Undergarments, Shoes or socks , Clothing, and Patient deferred removal          Right shoulder 5 mm blue, gray papule   Assessment & Plan    SKIN CANCER SCREENING PERFORMED TODAY.  BENIGN SKIN FINDINGS  - Lentigines  - Hemangiomas   - Nevus/Multiple Benign Nevi - Reassurance provided regarding the benign appearance of lesions noted on exam today; no treatment is indicated in the absence of symptoms/changes. - Reinforced importance of photoprotective strategies including liberal and frequent sunscreen use of a broad-spectrum SPF 30 or greater, use of protective clothing, and sun avoidance for prevention of cutaneous malignancy and photoaging.  Counseled patient on the importance of regular self-skin monitoring as well as routine clinical skin examinations  as scheduled.   ACTINIC DAMAGE - Chronic condition, secondary to cumulative UV/sun exposure - Recommend daily broad spectrum sunscreen SPF 30+ to sun-exposed areas, reapply every 2 hours as needed.  - Staying in the shade or wearing long sleeves, sun glasses (UVA+UVB protection) and wide brim hats (4-inch brim around the entire circumference of the hat) are also recommended for sun protection.  - Call for new or changing lesions.  Personal history of dysplastic nevi  - Reviewed medical history for full details  - Reviewed sun protective measures as above - Encouraged full body skin exams   Level of service outlined above   Procedures, orders, diagnosis for this visit:  NEOPLASM OF SKIN Right shoulder Skin / nail biopsy Type of biopsy: punch   Informed consent: discussed and consent obtained   Timeout: patient name, date of birth, surgical site, and procedure verified   Procedure prep:  Patient was prepped and draped in usual sterile fashion Prep type:  Isopropyl alcohol Anesthesia: the lesion was anesthetized in a standard fashion   Anesthetic:  1% lidocaine  w/ epinephrine 1-100,000 buffered w/ 8.4% NaHCO3 Punch size:  4 mm Suture size:  4-0 Suture type: Prolene (polypropylene)   Suture removal (days):  7 Hemostasis achieved with: suture, pressure and aluminum chloride   Outcome: patient tolerated procedure well   Post-procedure details: sterile dressing applied and wound care instructions given   Dressing type: bandage and petrolatum    Specimen 1 - Surgical pathology Differential Diagnosis: Blue nevus vs melanoma vs dermatofibroma vs DFSP vs cyst vs angioma vs other   Check Margins: No 5 mm blue, gray  papule SKIN EXAM FOR MALIGNANT NEOPLASM   LENTIGO   CHERRY ANGIOMA   MULTIPLE BENIGN NEVI   HISTORY OF DYSPLASTIC NEVUS    Neoplasm of skin -     Skin / nail biopsy -     Surgical pathology; Standing  Skin exam for malignant neoplasm  Lentigo  Cherry  angioma  Multiple benign nevi  History of dysplastic nevus    Return to clinic: Return in about 1 week (around 07/17/2024) for Suture Removal.  Documentation:  I, Jacquelynn V. Wilfred, CMA, am acting as scribe for Lauraine JAYSON Kanaris, MD.  I have reviewed the above documentation for accuracy and completeness, and I agree with the above.  Lauraine JAYSON Kanaris, MD

## 2024-07-10 NOTE — Patient Instructions (Addendum)

## 2024-07-11 LAB — SURGICAL PATHOLOGY

## 2024-07-12 ENCOUNTER — Ambulatory Visit: Payer: Self-pay

## 2024-07-12 NOTE — Telephone Encounter (Signed)
 Patient advised of BX results. aw

## 2024-07-12 NOTE — Telephone Encounter (Signed)
-----   Message from Lauraine JAYSON Kanaris sent at 07/12/2024  9:31 AM EDT -----  1. Skin, right shoulder :       EPIDERMAL INCLUSION CYST   Please notify patient with below plan: Benign, observe.   ----- Message ----- From: Interface, Lab In Three Zero One Sent: 07/11/2024   4:41 PM EDT To: Lauraine JAYSON Kanaris, MD

## 2024-07-17 ENCOUNTER — Ambulatory Visit

## 2024-07-17 DIAGNOSIS — Z48817 Encounter for surgical aftercare following surgery on the skin and subcutaneous tissue: Secondary | ICD-10-CM

## 2024-07-17 DIAGNOSIS — Z4802 Encounter for removal of sutures: Secondary | ICD-10-CM

## 2024-07-17 NOTE — Patient Instructions (Signed)

## 2024-07-17 NOTE — Progress Notes (Signed)
   Follow-Up Visit   Subjective  Briana Cummings is a 43 y.o. female who presents for the following: Suture removal  Pathology showed epidermal inclusion cyst  The following portions of the chart were reviewed this encounter and updated as appropriate: medications, allergies, medical history  Review of Systems:  No other skin or systemic complaints except as noted in HPI or Assessment and Plan.  Objective  Well appearing patient in no apparent distress; mood and affect are within normal limits.  Areas Examined: Right shoulder  Relevant physical exam findings are noted in the Assessment and Plan.    Assessment & Plan    Encounter for Removal of Sutures - Incision site is clean, dry and intact. - Wound cleansed, sutures removed, wound cleansed and steri strips applied.  - Discussed pathology results showing epidermal inclusion cyst - Patient advised to keep steri-strips dry until they fall off. - Scars remodel for a full year. - Once steri-strips fall off, patient can apply over-the-counter silicone scar cream once to twice a day to help with scar remodeling if desired. - Patient advised to call with any concerns or if they notice any new or changing lesions.  Return as scheduled, for TBSE (switch to Dr. Raymund).  LILLETTE Andrea Kerns, CMA, am acting as scribe for Lauraine JAYSON Raymund, MD .   Documentation: I have reviewed the above documentation for accuracy and completeness, and I agree with the above.  Lauraine JAYSON Raymund, MD

## 2024-08-02 ENCOUNTER — Ambulatory Visit: Admitting: Dermatology

## 2024-10-24 ENCOUNTER — Ambulatory Visit: Payer: Managed Care, Other (non HMO) | Admitting: Dermatology

## 2024-10-30 ENCOUNTER — Encounter

## 2024-10-30 ENCOUNTER — Encounter: Admitting: Dermatology

## 2024-12-25 ENCOUNTER — Encounter
